# Patient Record
Sex: Female | Born: 1965 | Race: Black or African American | Hispanic: No | Marital: Single | Smoking: Never smoker
Health system: Southern US, Community
[De-identification: ages and names within clinical notes are randomized; demographics above are authoritative.]

## PROBLEM LIST (undated history)

## (undated) DIAGNOSIS — E785 Hyperlipidemia, unspecified: Secondary | ICD-10-CM

## (undated) DIAGNOSIS — H9319 Tinnitus, unspecified ear: Secondary | ICD-10-CM

## (undated) DIAGNOSIS — I10 Essential (primary) hypertension: Secondary | ICD-10-CM

## (undated) HISTORY — DX: Tinnitus, unspecified ear: H93.19

## (undated) HISTORY — PX: ABDOMINAL HYSTERECTOMY: SHX81

---

## 2007-07-21 ENCOUNTER — Ambulatory Visit: Payer: Self-pay | Admitting: Family Medicine

## 2011-01-15 ENCOUNTER — Ambulatory Visit: Payer: Self-pay

## 2011-04-03 ENCOUNTER — Ambulatory Visit: Payer: Self-pay | Admitting: Otolaryngology

## 2011-04-19 ENCOUNTER — Ambulatory Visit: Payer: Self-pay | Admitting: Otolaryngology

## 2012-06-12 LAB — HM MAMMOGRAPHY: HM Mammogram: NEGATIVE

## 2012-12-20 IMAGING — US US CAROTID DUPLEX BILAT
1 series · 17 of 24 positions shown · non-contrast
Comparison: none

REASON FOR EXAM: pulsatile tinnitus
COMMENTS:

[Series 1: us carotid duplex bilat · 17 of 62 slices shown]
[im 1/62]
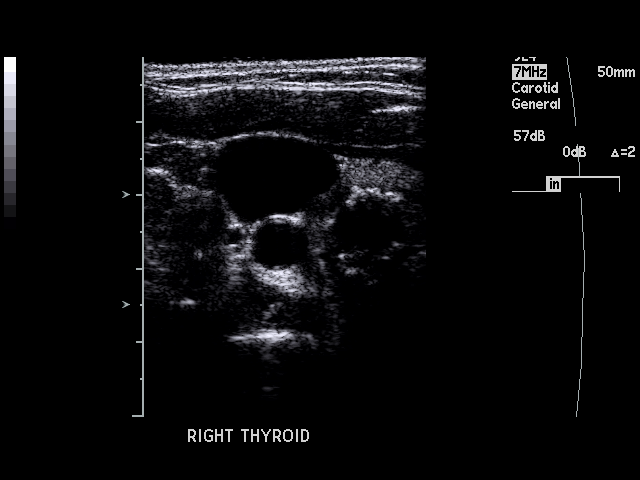
[im 6/62]
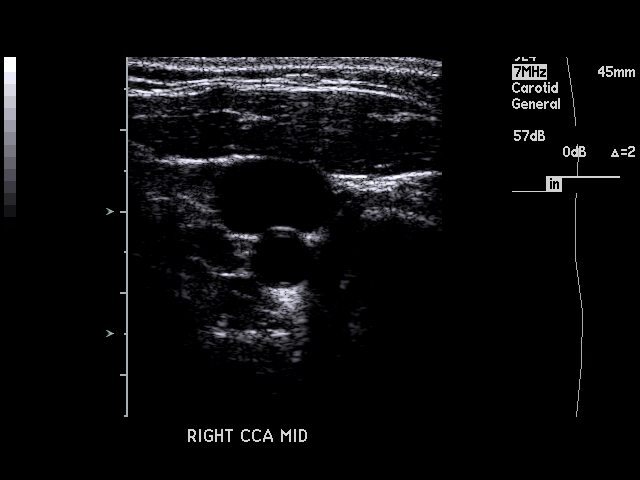
[im 8/62]
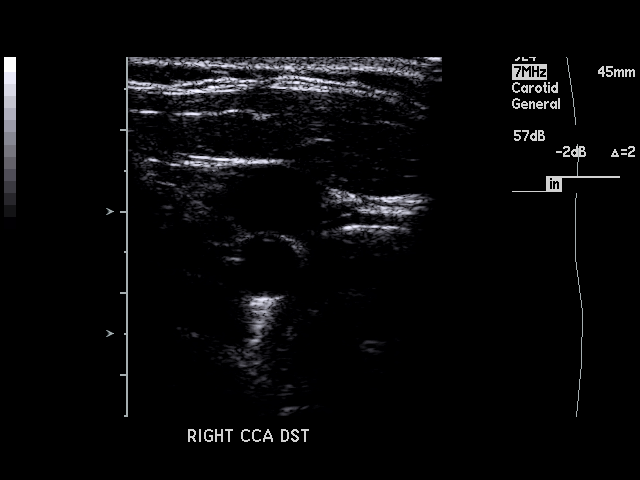
[im 11/62]
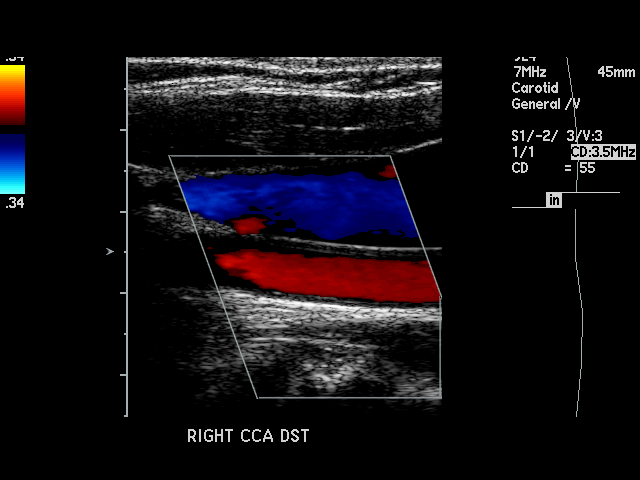
[im 16/62]
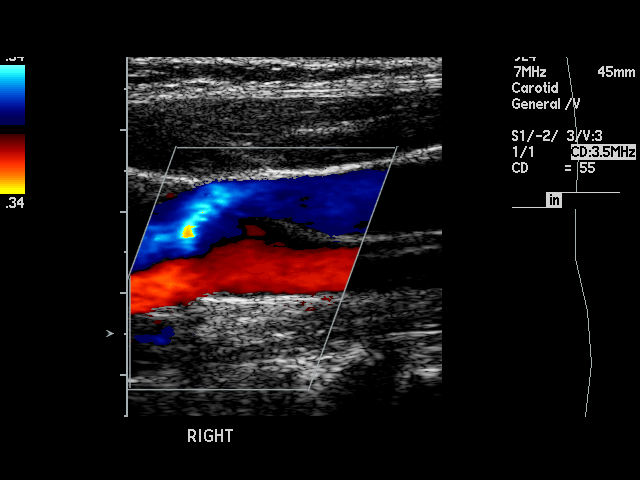
[im 19/62]
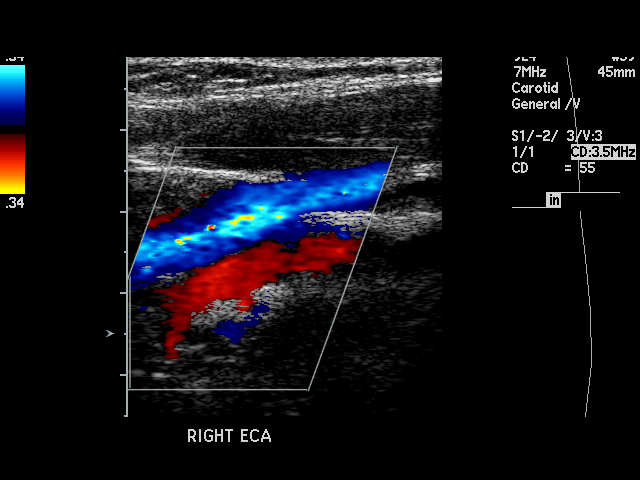
[im 24/62]
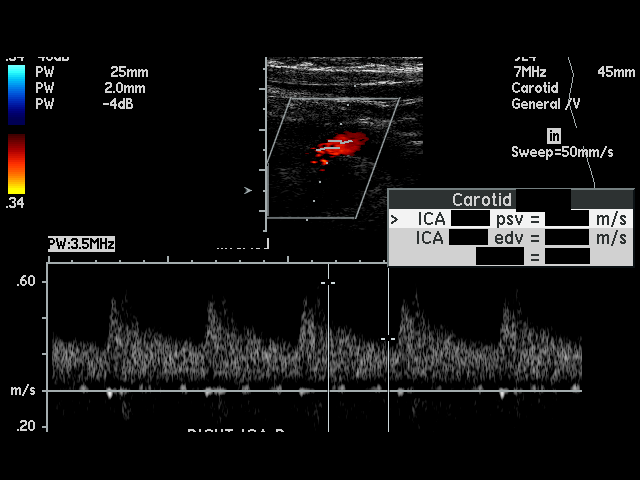
[im 27/62]
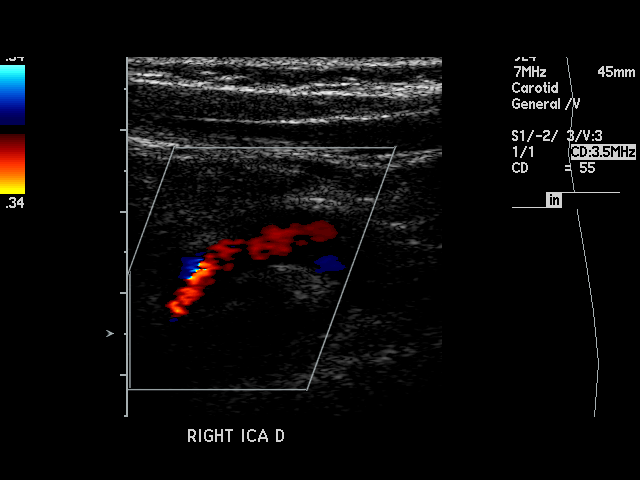
[im 32/62]
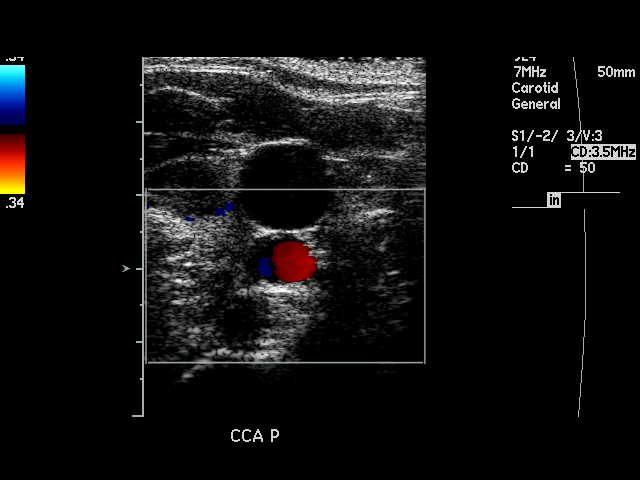
[im 35/62]
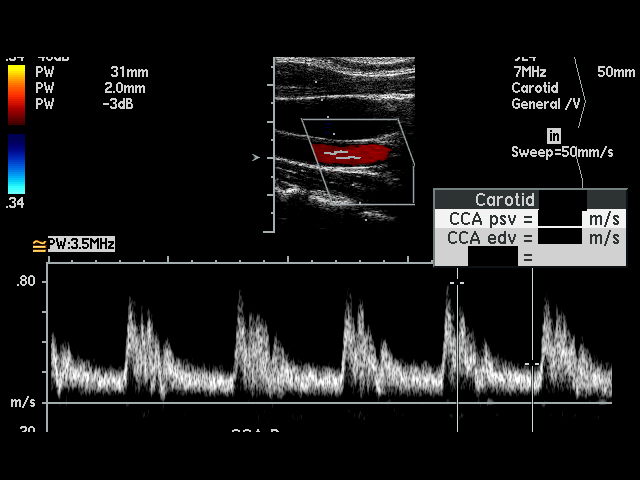
[im 38/62]
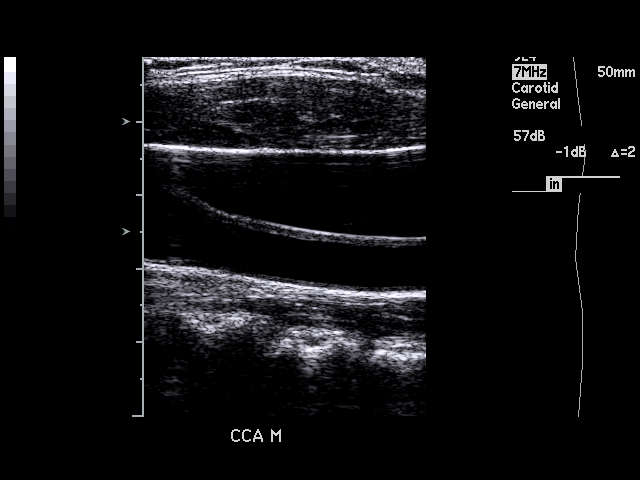
[im 43/62]
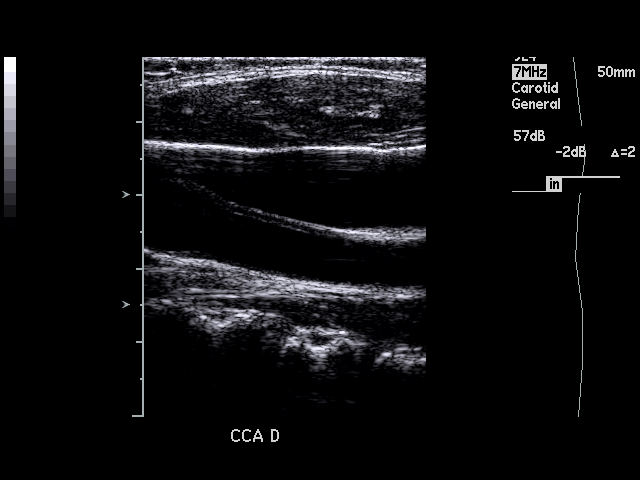
[im 46/62]
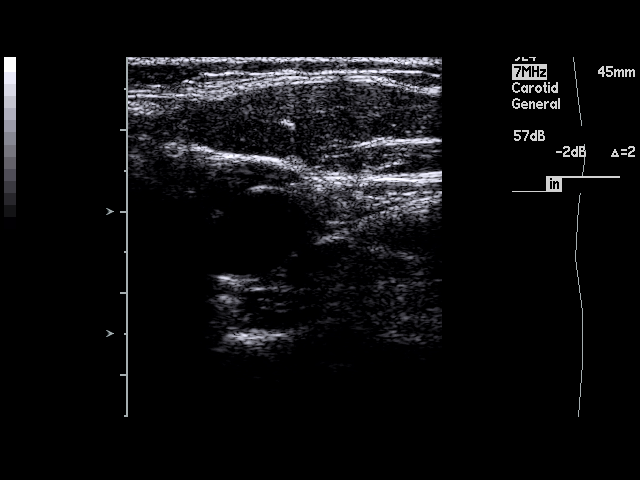
[im 51/62]
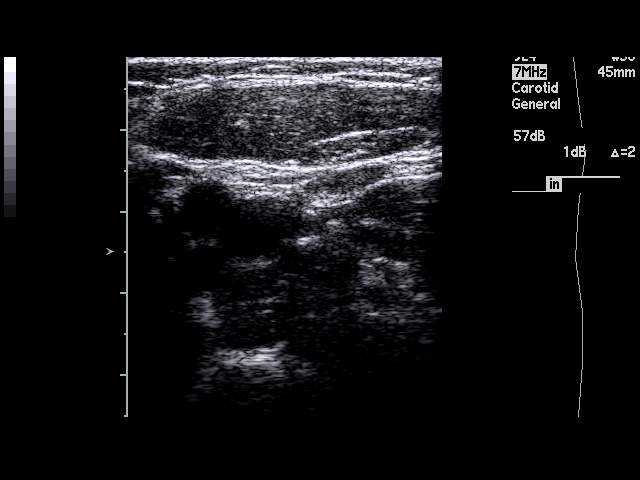
[im 54/62]
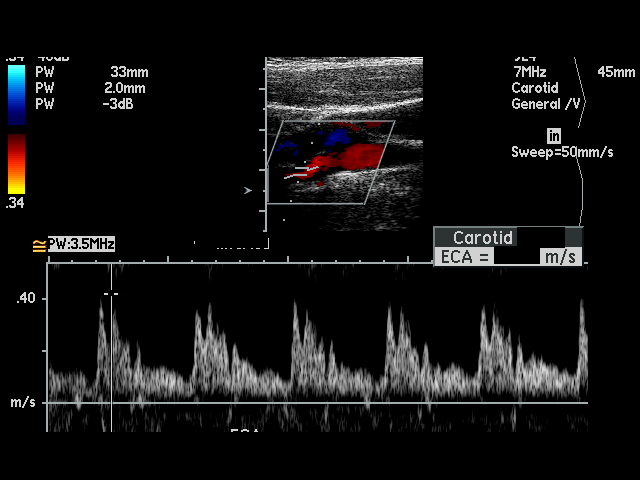
[im 56/62]
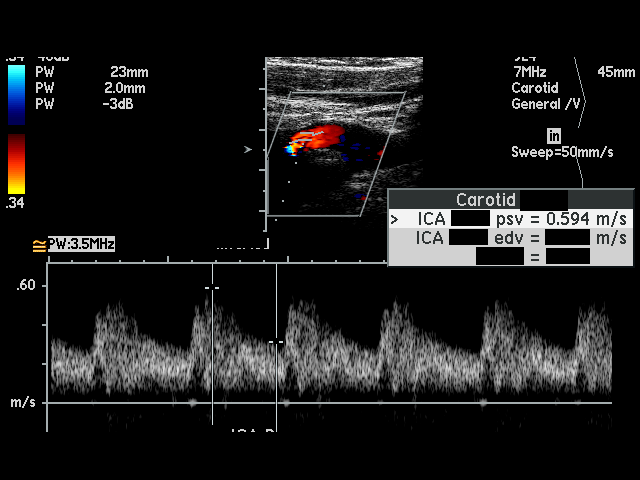
[im 62/62]
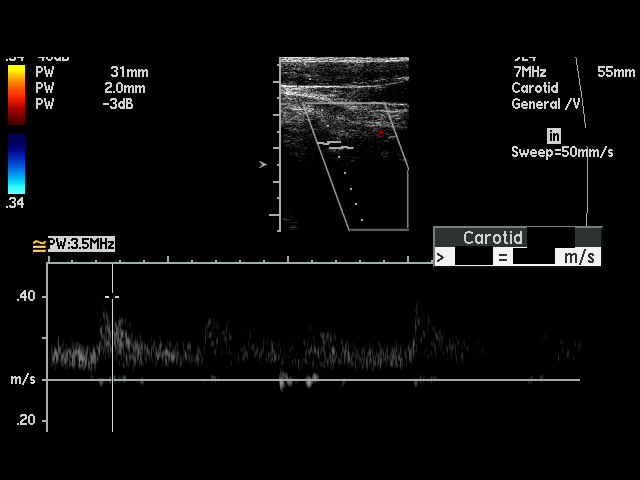

[17 of 24 positions shown; findings below may reference images not displayed]

PROCEDURE:     KOFI BOATENG - KOFI BOATENG CAROTID DOPPLER BILATERAL  - April 03, 2011  [DATE]

RESULT:     Grayscale, duplex, SPECTRAL waveform, color-flow real-time
sonographic imaging was performed of the right and left carotid systems.
Visual evaluation of the right and left carotid system demonstrates no
evidence of mural plaque. Color-flow and SPECTRAL waveform imaging is
unremarkable within the right and left carotid systems. Antegrade flow is
identified within the right and left vertebral arteries.

ICA/CCA ratios: Right: 1.556 and left 1.496.
IMPRESSION: No sonographic evidence of hemodynamically significant stenosis within the
right or left carotid systems.

## 2015-06-13 ENCOUNTER — Encounter: Payer: Self-pay | Admitting: Nurse Practitioner

## 2015-06-13 ENCOUNTER — Ambulatory Visit (INDEPENDENT_AMBULATORY_CARE_PROVIDER_SITE_OTHER): Payer: 59 | Admitting: Nurse Practitioner

## 2015-06-13 VITALS — BP 142/100 | HR 73 | Temp 98.4°F | Resp 16 | Ht 59.0 in | Wt 188.6 lb

## 2015-06-13 DIAGNOSIS — Z7189 Other specified counseling: Secondary | ICD-10-CM | POA: Diagnosis not present

## 2015-06-13 DIAGNOSIS — E669 Obesity, unspecified: Secondary | ICD-10-CM | POA: Diagnosis not present

## 2015-06-13 DIAGNOSIS — I10 Essential (primary) hypertension: Secondary | ICD-10-CM | POA: Diagnosis not present

## 2015-06-13 DIAGNOSIS — H9313 Tinnitus, bilateral: Secondary | ICD-10-CM

## 2015-06-13 DIAGNOSIS — Z7689 Persons encountering health services in other specified circumstances: Secondary | ICD-10-CM

## 2015-06-13 NOTE — Progress Notes (Signed)
Pre visit review using our clinic review tool, if applicable. No additional management support is needed unless otherwise documented below in the visit note. 

## 2015-06-13 NOTE — Patient Instructions (Signed)
Welcome to [facility]!   Nice to meet you today. You are doing great with exercise. Use the tip sheet to help you focus on healthy meals. Follow up in 2 weeks for blood pressure (goal below 140/90) and weight loss (goal loss of 1-2 lbs).

## 2015-06-13 NOTE — Progress Notes (Signed)
Patient ID: Norman Clayonia M Tutterow, female    DOB: 04/18/1966  Age: 49 y.o. MRN: [id number]  CC: Establish Care   HPI Norman Clayonia M Salois presents for   1) New pt info:   Immunizations- tdap 2011; refused flu vaccine  Mammogram- 2013 [place], Normal per pt  Pap- Hysterectomy 15 years ago partial    Ovaries only she thinks   Colonoscopy- Interested in cologuard   Eye Exam- 2014, scheduled   Dental Exam- UTD  LMP- 15 years ago   2) Chronic Problems-  Obesity- 4 days a week 45 min    Diet- No formal   Tinnitus- audiologist, tinnitus- right 5 years   HTN- In the past has had occasional issues with this. Denies being on medications  3) Acute Problems- wants to discuss obesity and hypertension.  5.8 A1c  255 tot chol  136 LDL  History Kymberly has a past medical history of Ringing in ear.   She has past surgical history that includes Abdominal hysterectomy.   Her family history includes Cancer in her father; Stroke in her mother.She reports that she has never smoked. She has never used smokeless tobacco. She reports that she drinks alcohol. She reports that she does not use illicit drugs.  No outpatient prescriptions prior to visit.   No facility-administered medications prior to visit.    ROS Review of Systems  Constitutional: Negative for fever, chills, diaphoresis and fatigue.  HENT: Positive for tinnitus.        Right side only  Respiratory: Negative for chest tightness, shortness of breath and wheezing.   Cardiovascular: Negative for chest pain, palpitations and leg swelling.  Gastrointestinal: Negative for nausea, vomiting and diarrhea.  Skin: Negative for rash.  Neurological: Negative for dizziness, weakness, numbness and headaches.  Psychiatric/Behavioral: Negative for suicidal ideas and sleep disturbance. The patient is nervous/anxious.     Objective:  BP 142/100 mmHg  Pulse 73  Temp(Src) 98.4 F (36.9 C)  Resp 16  Ht 4\' 11"  (1.499 m)  Wt 188 lb 9.6 oz (85.548 kg)   BMI 38.07 kg/m2  SpO2 99%  Physical Exam  Constitutional: She is oriented to person, place, and time. She appears well-developed and well-nourished. No distress.  HENT:  Head: Normocephalic and atraumatic.  Right Ear: External ear normal.  Left Ear: External ear normal.  Cardiovascular: Normal rate, regular rhythm and normal heart sounds.  Exam reveals no gallop and no friction rub.   No murmur heard. Pulmonary/Chest: Effort normal and breath sounds normal. No respiratory distress. She has no wheezes. She has no rales. She exhibits no tenderness.  Neurological: She is alert and oriented to person, place, and time. No cranial nerve deficit. She exhibits normal muscle tone. Coordination normal.  Skin: Skin is warm and dry. No rash noted. She is not diaphoretic.  Psychiatric: She has a normal mood and affect. Her behavior is normal. Judgment and thought content normal.   Assessment & Plan:   Dorisann Framesonia was seen today for establish care.  Diagnoses and all orders for this visit:  Essential hypertension  Encounter to establish care  Obesity  Tinnitus, bilateral  [name] does not currently have medications on file.  No orders of the defined types were placed in this encounter.     Follow-up: Return in about 2 weeks (around 06/27/2015) for HTN and Wt loss.

## 2015-06-18 DIAGNOSIS — Z0000 Encounter for general adult medical examination without abnormal findings: Secondary | ICD-10-CM | POA: Insufficient documentation

## 2015-06-18 DIAGNOSIS — Z7689 Persons encountering health services in other specified circumstances: Secondary | ICD-10-CM | POA: Insufficient documentation

## 2015-06-18 DIAGNOSIS — E669 Obesity, unspecified: Secondary | ICD-10-CM | POA: Insufficient documentation

## 2015-06-18 DIAGNOSIS — I10 Essential (primary) hypertension: Secondary | ICD-10-CM | POA: Insufficient documentation

## 2015-06-18 DIAGNOSIS — H9319 Tinnitus, unspecified ear: Secondary | ICD-10-CM | POA: Insufficient documentation

## 2015-06-18 NOTE — Assessment & Plan Note (Addendum)
BP Readings from Last 3 Encounters:  06/13/15 142/100   Patient reports she has had occasionally elevated blood pressures in the past. We will follow-up in 2 weeks if she still is elevated we will start on medications.

## 2015-06-18 NOTE — Assessment & Plan Note (Addendum)
Education provided on exercise and healthy diet. Benefits and goals discussed. Follow-up in 2 weeks

## 2015-06-18 NOTE — Assessment & Plan Note (Signed)
Right side only has been evaluated by an audiologist approximately 5 years.

## 2015-06-18 NOTE — Assessment & Plan Note (Signed)
Discussed acute and chronic issues. Reviewed health maintenance measures, PFSHx, and immunizations. Obtain records from previous facility.   

## 2015-06-27 ENCOUNTER — Ambulatory Visit (INDEPENDENT_AMBULATORY_CARE_PROVIDER_SITE_OTHER): Payer: 59 | Admitting: Nurse Practitioner

## 2015-06-27 VITALS — BP 128/78 | HR 78 | Temp 98.2°F | Resp 16 | Ht 59.0 in | Wt 187.2 lb

## 2015-06-27 DIAGNOSIS — Z1239 Encounter for other screening for malignant neoplasm of breast: Secondary | ICD-10-CM | POA: Diagnosis not present

## 2015-06-27 DIAGNOSIS — I10 Essential (primary) hypertension: Secondary | ICD-10-CM | POA: Diagnosis not present

## 2015-06-27 DIAGNOSIS — E669 Obesity, unspecified: Secondary | ICD-10-CM

## 2015-06-27 NOTE — Patient Instructions (Signed)
[facility]  Address: [address], [place], [place] [address]  Phone: [phone] Hours:  Monday - Thursday: 8 a.m. - 5 p.m. Friday: 8 a.m. - 3 p.m.  See you in 3 months!

## 2015-06-27 NOTE — Progress Notes (Signed)
Pre visit review using our clinic review tool, if applicable. No additional management support is needed unless otherwise documented below in the visit note. 

## 2015-06-27 NOTE — Progress Notes (Signed)
Patient ID: Emma Riggs, female    DOB: 09/25/1965  Age: 49 y.o. MRN: [id number]  CC: Follow-up   HPI Emma Riggs presents for follow up of weight loss and HTN.   1) Walking- 3 x a week for a few minutes Diet- Cutting down, substituting, and watching portions  Wt Readings from Last 3 Encounters:  06/27/15 187 lb 3.2 oz (84.913 kg)  06/13/15 188 lb 9.6 oz (85.548 kg)   BP Readings from Last 3 Encounters:  06/27/15 128/78  06/13/15 142/100    History Emma Riggs has a past medical history of Ringing in ear.   Emma Riggs has past surgical history that includes Abdominal hysterectomy.   Emma Riggs family history includes Cancer in Emma Riggs father; Stroke in Emma Riggs mother.Emma Riggs reports that Emma Riggs has never smoked. Emma Riggs has never used smokeless tobacco. Emma Riggs reports that Emma Riggs drinks alcohol. Emma Riggs reports that Emma Riggs does not use illicit drugs.  No outpatient prescriptions prior to visit.   No facility-administered medications prior to visit.    ROS Review of Systems  Constitutional: Negative for fever, chills, diaphoresis and fatigue.  Respiratory: Negative for chest tightness, shortness of breath and wheezing.   Cardiovascular: Negative for chest pain, palpitations and leg swelling.  Gastrointestinal: Negative for nausea, vomiting and diarrhea.  Skin: Negative for rash.  Neurological: Negative for dizziness, weakness, numbness and headaches.  Psychiatric/Behavioral: The patient is not nervous/anxious.     Objective:  BP 128/78 mmHg  Pulse 78  Temp(Src) 98.2 F (36.8 C)  Resp 16  Ht 4\' 11"  (1.499 m)  Wt 187 lb 3.2 oz (84.913 kg)  BMI 37.79 kg/m2  SpO2 98%  Physical Exam  Constitutional: Emma Riggs is oriented to person, place, and time. Emma Riggs appears well-developed and well-nourished. No distress.  HENT:  Head: Normocephalic and atraumatic.  Right Ear: External ear normal.  Left Ear: External ear normal.  Cardiovascular: Normal rate, regular rhythm and normal heart sounds.  Exam reveals no gallop and no  friction rub.   No murmur heard. Pulmonary/Chest: Effort normal and breath sounds normal. No respiratory distress. Emma Riggs has no wheezes. Emma Riggs has no rales. Emma Riggs exhibits no tenderness.  Neurological: Emma Riggs is alert and oriented to person, place, and time. No cranial nerve deficit. Emma Riggs exhibits normal muscle tone. Coordination normal.  Skin: Skin is warm and dry. No rash noted. Emma Riggs is not diaphoretic.  Psychiatric: Emma Riggs has a normal mood and affect. Emma Riggs behavior is normal. Judgment and thought content normal.   Assessment & Plan:   Emma Riggs was seen today for follow-up.  Diagnoses and all orders for this visit:  Screening for breast cancer -     MM Digital Screening; Future  Essential hypertension  Obesity   Emma Riggs does not currently have medications on file.  No orders of the defined types were placed in this encounter.     Follow-up: Return in about 3 months (around 09/27/2015) for Weight loss.

## 2015-07-02 ENCOUNTER — Encounter: Payer: Self-pay | Admitting: Nurse Practitioner

## 2015-07-02 DIAGNOSIS — Z1239 Encounter for other screening for malignant neoplasm of breast: Secondary | ICD-10-CM | POA: Insufficient documentation

## 2015-07-02 NOTE — Assessment & Plan Note (Signed)
BP Readings from Last 3 Encounters:  06/27/15 128/78  06/13/15 142/100   Improved. Will follow

## 2015-07-02 NOTE — Assessment & Plan Note (Signed)
Wt Readings from Last 3 Encounters:  06/27/15 187 lb 3.2 oz (84.913 kg)  06/13/15 188 lb 9.6 oz (85.548 kg)   Down 1 pound since last visit. Encouraged healthy eating and exercise. Gave handout with low carb options. Will follow up in 3 months.

## 2015-07-02 NOTE — Assessment & Plan Note (Signed)
Ordered Mammogram.

## 2015-10-03 ENCOUNTER — Ambulatory Visit: Payer: 59 | Admitting: Nurse Practitioner

## 2015-10-12 ENCOUNTER — Encounter: Payer: Self-pay | Admitting: Nurse Practitioner

## 2015-10-26 ENCOUNTER — Ambulatory Visit
Admission: RE | Admit: 2015-10-26 | Discharge: 2015-10-26 | Disposition: A | Discharge status: Home / Self Care | Payer: 59 | Source: Ambulatory Visit | Attending: Nurse Practitioner | Admitting: Nurse Practitioner

## 2015-10-26 ENCOUNTER — Ambulatory Visit: Payer: 59 | Admitting: Nurse Practitioner

## 2015-10-26 DIAGNOSIS — Z1231 Encounter for screening mammogram for malignant neoplasm of breast: Secondary | ICD-10-CM | POA: Diagnosis present

## 2015-10-26 DIAGNOSIS — Z1239 Encounter for other screening for malignant neoplasm of breast: Secondary | ICD-10-CM

## 2016-05-30 ENCOUNTER — Ambulatory Visit (INDEPENDENT_AMBULATORY_CARE_PROVIDER_SITE_OTHER): Payer: 59 | Admitting: Family

## 2016-05-30 ENCOUNTER — Encounter: Payer: Self-pay | Admitting: Family

## 2016-05-30 DIAGNOSIS — E782 Mixed hyperlipidemia: Secondary | ICD-10-CM | POA: Diagnosis not present

## 2016-05-30 DIAGNOSIS — Z7689 Persons encountering health services in other specified circumstances: Secondary | ICD-10-CM

## 2016-05-30 DIAGNOSIS — I10 Essential (primary) hypertension: Secondary | ICD-10-CM | POA: Diagnosis not present

## 2016-05-30 DIAGNOSIS — E785 Hyperlipidemia, unspecified: Secondary | ICD-10-CM | POA: Insufficient documentation

## 2016-05-30 NOTE — Progress Notes (Signed)
Subjective:    Patient ID: Emma Riggs, female    DOB: [birth year], 50 y.o.   MRN: [id number]  CC: Emma Riggs is a 50 y.o. female who presents today to establish care.   HPI: Patient is here for follow-up and to establish care. She was last seen in our clinic 1 year ago. No complaints however she remains pressured by her weight.  She is recently started walking again and went to the gym for 1 hour this week. She is caring for her aging mother and does not have the time to care for herself.  She brought with her labs from her screening at her employer which we discussed in great detail. These have also been scanned her chart. Her blood pressure screening had been 142 SBP and she was worried she had HTN. She has never been treated for high blood pressure. Patient is not having any shortness of breath, chest pain, palpitations, changes in vision, headache.  No family history of colon cancer. She has no personal history of abdominal bleeds. She would prefer cologuard.      HISTORY:  Past Medical History:  Diagnosis Date  . Ringing in ear    Past Surgical History:  Procedure Laterality Date  . ABDOMINAL HYSTERECTOMY     Partial   Family History  Problem Relation Age of Onset  . Stroke Mother   . Cancer Father     Lung Cancer    Allergies: Review of patient's allergies indicates no known allergies. No current outpatient prescriptions on file prior to visit.   No current facility-administered medications on file prior to visit.     Social History  Substance Use Topics  . Smoking status: Never Smoker  . Smokeless tobacco: Never Used  . Alcohol use 0.0 oz/week     Comment: Rare    Review of Systems  Constitutional: Negative for chills and fever.  Eyes: Negative for visual disturbance.  Respiratory: Negative for cough.   Cardiovascular: Negative for chest pain and palpitations.  Gastrointestinal: Negative for nausea and vomiting.  Neurological: Negative for  headaches.      Objective:    BP 136/88   Pulse 90   Temp 98 F (36.7 C) (Oral)   Ht 4\' 11"  (1.499 m)   Wt 190 lb 12.8 oz (86.5 kg)   SpO2 96%   BMI 38.54 kg/m  BP Readings from Last 3 Encounters:  05/30/16 136/88  06/27/15 128/78  06/13/15 (!) 142/100   Wt Readings from Last 3 Encounters:  05/30/16 190 lb 12.8 oz (86.5 kg)  06/27/15 187 lb 3.2 oz (84.9 kg)  06/13/15 188 lb 9.6 oz (85.5 kg)    Physical Exam  Constitutional: She appears well-developed and well-nourished.  Eyes: Conjunctivae are normal.  Cardiovascular: Normal rate, regular rhythm, normal heart sounds and normal pulses.   Pulmonary/Chest: Effort normal and breath sounds normal. She has no wheezes. She has no rhonchi. She has no rales.  Neurological: She is alert.  Skin: Skin is warm and dry.  Psychiatric: She has a normal mood and affect. Her speech is normal and behavior is normal. Thought content normal.  Vitals reviewed.      Assessment & Plan:   Problem List Items Addressed This Visit      Cardiovascular and Mediastinum   Essential hypertension    At goal today.We discussed at great length the importance of lifestyle modifications. We'll continue to follow.         Other  Encounter to establish care    Reviewed past medical, surgical and family history. Order mammogram and Cologuard for patient today. Patient will follow-up in a couple months.      Hyperlipemia    Elevated based on screening labs by her employer. We discussed at great length the importance of lifestyle modifications. Patient preferred to start lifestyle prior to medication treatment today. She will follow-up in 3 months.       Other Visit Diagnoses   None.      [name] does not currently have medications on file.   No orders of the defined types were placed in this encounter.   Return precautions given.   Risks, benefits, and alternatives of the medications and treatment plan prescribed today were  discussed, and patient expressed understanding.   Education regarding symptom management and diagnosis given to patient on AVS.  Continue to follow with Rennie PlowmanMargaret Arnett, FNP for routine health maintenance.   Norman Clayonia M Consolo and I agreed with plan.   Rennie PlowmanMargaret Arnett, FNP

## 2016-05-30 NOTE — Progress Notes (Signed)
Pre visit review using our clinic review tool, if applicable. No additional management support is needed unless otherwise documented below in the visit note. 

## 2016-05-30 NOTE — Assessment & Plan Note (Addendum)
At goal today.We discussed at great length the importance of lifestyle modifications. We'll continue to follow.

## 2016-05-30 NOTE — Assessment & Plan Note (Addendum)
New.Elevated based on screening labs by her employer. We discussed at great length the importance of lifestyle modifications. Patient preferred to start lifestyle prior to medication treatment today. She will follow-up in 3 months.

## 2016-05-30 NOTE — Patient Instructions (Signed)
As We discussed, lets focus on diet and exercise to turn the ship around to prevent high cholesterol and high blood pressure.  I know you can do it!  See you in 3 months.   This is  Dr. Melina Schoolsullo's  example of a  "Low GI"  Diet:  It will allow you to lose 4 to 8  lbs  per month if you follow it carefully.  Your goal with exercise is a minimum of 30 minutes of aerobic exercise 5 days per week (Walking does not count once it becomes easy!)    All of the foods can be found at grocery stores and in bulk at [employer].  The Atkins protein bars and shakes are available in more varieties at Target, WalMart and Lowe's Foods.     7 AM Breakfast:  Choose from the following:  Low carbohydrate Protein  Shakes (I recommend the  Premier Protein chocolate shakes,  EAS AdvantEdge "Carb Control" shakes  Or the Atkins shakes all are under 3 net carbs)     a scrambled egg/bacon/cheese burrito made with Mission's "carb balance" whole wheat tortilla  (about 10 net carbs )  Medical laboratory scientific officerJimmy Deans sells microwaveable frittata (basically a quiche without the pastry crust) that is eaten cold and very convenient way to get your eggs.  8 carbs)  If you make your own protein shakes, avoid bananas and pineapple,  And use low carb greek yogurt or original /unsweetened almond or soy milk    Avoid cereal and bananas, oatmeal and cream of wheat and grits. They are loaded with carbohydrates!   10 AM: high protein snack:  Protein bar by Atkins (the snack size, under 200 cal, usually < 6 net carbs).    A stick of cheese:  Around 1 carb,  100 cal     Dannon Light n Fit [place]Greek Yogurt  (80 cal, 8 carbs)  Other so called "protein bars" and Greek yogurts tend to be loaded with carbohydrates.  Remember, in food advertising, the word "energy" is synonymous for " carbohydrate."  Lunch:   A Sandwich using the bread choices listed, Can use any  Eggs,  lunchmeat, grilled meat or canned tuna), avocado, regular mayo/mustard  and cheese.  A Salad using  blue cheese, ranch,  Goddess or vinagrette,  Avoid taco shells, croutons or "confetti" and no "candied nuts" but regular nuts OK.   No pretzels, nabs  or chips.  Pickles and miniature sweet peppers are a good low carb alternative that provide a "crunch"  The bread is the only source of carbohydrate in a sandwich and  can be decreased by trying some of the attached alternatives to traditional loaf bread   Avoid "Low fat dressings, as well as Reyne DumasCatalina and [place] Food[place] dressings They are loaded with sugar!   3 PM/ Mid day  Snack:  Consider  1 ounce of  almonds, walnuts, pistachios, pecans, peanuts,  Macadamia nuts or a nut medley.  Avoid "granola and granola bars "  Mixed nuts are ok in moderation as long as there are no raisins,  cranberries or dried fruit.   KIND bars are OK if you get the low glycemic index variety   Try the prosciutto/mozzarella cheese sticks by Fiorruci  In deli /backery section   High protein      6 PM  Dinner:     Meat/fowl/fish with a green salad, and either broccoli, cauliflower, green beans, spinach, brussel sprouts or  Lima beans. DO NOT BREAD THE  PROTEIN!!      There is a low carb pasta by Dreamfield's that is acceptable and tastes great: only 5 digestible carbs/serving.( All grocery stores but BJs carry it ) Several ready made meals are available low carb:   Try Michel Angelo's chicken piccata or chicken or eggplant parm over low carb pasta.(Lowes and BJs)   Clifton Custard Sanchez's "Carnitas" (pulled pork, no sauce,  0 carbs) or his beef pot roast to make a dinner burrito (at BJ's)  Pesto over low carb pasta (bj's sells a good quality pesto in the center refrigerated section of the deli   Try satueeing  Roosvelt Harps with mushroooms as a good side   Green Giant makes a mashed cauliflower that tastes like mashed potatoes  Whole wheat pasta is still full of digestible carbs and  Not as low in glycemic index as Dreamfield's.   Brown rice is still rice,  So skip the rice and  noodles if you eat [place] or [place] (or at least limit to 1/2 cup)  9 PM snack :   Breyer's "low carb" fudgsicle or  ice cream bar (Carb Smart line), or  Weight Watcher's ice cream bar , or another "no sugar added" ice cream;  a serving of fresh berries/cherries with whipped cream   Cheese or DANNON'S LlGHT N FIT GREEK YOGURT  8 ounces of [place] unsweetened almond/cococunut milk    Treat yourself to a parfait made with whipped cream blueberiies, walnuts and vanilla greek yogurt  Avoid bananas, pineapple, grapes  and watermelon on a regular basis because they are high in sugar.  THINK OF THEM AS DESSERT  Remember that snack Substitutions should be less than 10 NET carbs per serving and meals < 20 carbs. Remember to subtract fiber grams to get the "net carbs."  @TULLOBREADPACKAGE @

## 2016-05-30 NOTE — Assessment & Plan Note (Signed)
Reviewed past medical, surgical and family history. Order mammogram and Cologuard for patient today. Patient will follow-up in a couple months.

## 2016-06-05 ENCOUNTER — Telehealth: Payer: Self-pay | Admitting: Family

## 2016-06-05 NOTE — Telephone Encounter (Signed)
Pt dropped off paper work for [employer]fl[employer]rnett to complete. Papers are up front in color folder.

## 2016-06-05 NOTE — Telephone Encounter (Signed)
Paperwork has been placed in provider folder on desk.

## 2016-06-27 LAB — COLOGUARD: COLOGUARD: NEGATIVE

## 2016-09-05 ENCOUNTER — Ambulatory Visit: Payer: 59 | Admitting: Family

## 2016-10-17 ENCOUNTER — Telehealth: Payer: Self-pay

## 2016-10-17 NOTE — Telephone Encounter (Signed)
Spoke with pt and she wanted to know if her cologuard results had been received. It doesn't look like they have been reviewed. Please advise.

## 2016-10-21 NOTE — Telephone Encounter (Signed)
Please call pt-  Results are negative for cologuard

## 2016-10-22 NOTE — Telephone Encounter (Signed)
Patient was informed of results.  Patient understood and no questions, comments, or concerns at this time.  

## 2017-06-17 ENCOUNTER — Ambulatory Visit (INDEPENDENT_AMBULATORY_CARE_PROVIDER_SITE_OTHER): Payer: 59 | Admitting: Family

## 2017-06-17 ENCOUNTER — Encounter: Payer: Self-pay | Admitting: Family

## 2017-06-17 VITALS — BP 134/96 | HR 82 | Temp 97.9°F | Ht 59.0 in | Wt 196.2 lb

## 2017-06-17 DIAGNOSIS — Z1239 Encounter for other screening for malignant neoplasm of breast: Secondary | ICD-10-CM

## 2017-06-17 DIAGNOSIS — I10 Essential (primary) hypertension: Secondary | ICD-10-CM

## 2017-06-17 DIAGNOSIS — Z1231 Encounter for screening mammogram for malignant neoplasm of breast: Secondary | ICD-10-CM | POA: Diagnosis not present

## 2017-06-17 DIAGNOSIS — E6609 Other obesity due to excess calories: Secondary | ICD-10-CM

## 2017-06-17 NOTE — Progress Notes (Signed)
Subjective:    Patient ID: Emma Riggs, female    DOB: Dec 24, 1965, 51 y.o.   MRN: [id number]  CC: Emma Riggs is a 51 y.o. female who presents today for follow up.   HPI: Of note, reviewed labs with patient. Low cardiovascular risk. She is prediabetic.   Elevated blood pressure. Would like to start exercise program again. Drinking soda.   Denies exertional chest pain or pressure, numbness or tingling radiating to left arm or jaw, palpitations, dizziness, frequent headaches, changes in vision, or shortness of breath.      HISTORY:  Past Medical History:  Diagnosis Date  . Ringing in ear    Past Surgical History:  Procedure Laterality Date  . ABDOMINAL HYSTERECTOMY     Partial   Family History  Problem Relation Age of Onset  . Stroke Mother   . Cancer Father        Lung Cancer    Allergies: Patient has no known allergies. No current outpatient prescriptions on file prior to visit.   No current facility-administered medications on file prior to visit.     Social History  Substance Use Topics  . Smoking status: Never Smoker  . Smokeless tobacco: Never Used  . Alcohol use 0.0 oz/week     Comment: Rare    Review of Systems  Constitutional: Negative for chills and fever.  Respiratory: Negative for cough.   Cardiovascular: Negative for chest pain and palpitations.  Gastrointestinal: Negative for nausea and vomiting.      Objective:    BP (!) 134/96   Pulse 82   Temp 97.9 F (36.6 C) (Oral)   Ht 4\' 11"  (1.499 m)   Wt 196 lb 3.2 oz (89 kg)   SpO2 99%   BMI 39.63 kg/m  BP Readings from Last 3 Encounters:  06/17/17 (!) 134/96  05/30/16 136/88  06/27/15 128/78   Wt Readings from Last 3 Encounters:  06/17/17 196 lb 3.2 oz (89 kg)  05/30/16 190 lb 12.8 oz (86.5 kg)  06/27/15 187 lb 3.2 oz (84.9 kg)    Physical Exam  Constitutional: She appears well-developed and well-nourished.  Eyes: Conjunctivae are normal.  Cardiovascular: Normal rate, regular  rhythm, normal heart sounds and normal pulses.   Pulmonary/Chest: Effort normal and breath sounds normal. She has no wheezes. She has no rhonchi. She has no rales.  Neurological: She is alert.  Skin: Skin is warm and dry.  Psychiatric: She has a normal mood and affect. Her speech is normal and behavior is normal. Thought content normal.  Vitals reviewed.      Assessment & Plan:   Problem List Items Addressed This Visit      Cardiovascular and Mediastinum   Essential hypertension    Elevated. Patient would like to focus on weight loss. She will keep BP log.        Other   Obesity    Discussed lifestyle modifications. Form completed for work. Note: reviewed labs with patient as well.       Screening for breast cancer - Primary    Would like to do mammogram every 2 years. Will do next year.           Emma Riggs does not currently have medications on file.   No orders of the defined types were placed in this encounter.   Return precautions given.   Risks, benefits, and alternatives of the medications and treatment plan prescribed today were discussed, and patient expressed understanding.  Education regarding symptom management and diagnosis given to patient on AVS.  Continue to follow with Emma Riggs, Emma Placide G, FNP for routine health maintenance.   Emma Riggs and I agreed with plan.   Emma PlowmanMargaret Hollyanne Schloesser, FNP

## 2017-06-17 NOTE — Assessment & Plan Note (Signed)
Discussed lifestyle modifications. Form completed for work. Note: reviewed labs with patient as well.

## 2017-06-17 NOTE — Patient Instructions (Addendum)
Monitor blood pressure,  Goal is less than 130/80; if persistently higher, please make sooner follow up appointment so we can recheck you blood pressure and manage medications  Come back back year for complete physical - we can also do pelvic exam to ensure no cervix after hysterectomy.   Good luck with working out-- you can DO IT!!

## 2017-06-17 NOTE — Assessment & Plan Note (Signed)
Elevated. Patient would like to focus on weight loss. She will keep BP log.

## 2017-06-17 NOTE — Assessment & Plan Note (Signed)
Would like to do mammogram every 2 years. Will do next year.

## 2017-12-10 ENCOUNTER — Ambulatory Visit: Payer: 59 | Admitting: Family Medicine

## 2017-12-10 ENCOUNTER — Other Ambulatory Visit: Payer: Self-pay

## 2017-12-10 DIAGNOSIS — H6121 Impacted cerumen, right ear: Secondary | ICD-10-CM

## 2017-12-10 DIAGNOSIS — H6120 Impacted cerumen, unspecified ear: Secondary | ICD-10-CM | POA: Insufficient documentation

## 2017-12-10 NOTE — Progress Notes (Signed)
  Emma AlarEric Branko Steeves, MD Phone: [phone]  Emma Riggs is a 52 y.o. female who presents today for same day visit.  Patient reports over the last week her right ear has felt a little swollen.  Notes it feels plugged up and she can not hear out of it very well.  It has not drained anything.  No pain.  States feels a little swollen anterior to this.  She notes she can hear her heartbeat in her ear though that has been chronic and she has been evaluated for that previously by ENT and had imaging done.  In the past she has tried Benadryl to help with this.  She has not tried this currently.  Social History   Tobacco Use  Smoking Status Never Smoker  Smokeless Tobacco Never Used     ROS see history of present illness  Objective  Physical Exam Vitals:   12/10/17 1555  BP: 112/88  Pulse: 72  Temp: 97.8 F (36.6 C)  SpO2: 99%    BP Readings from Last 3 Encounters:  12/10/17 112/88  06/17/17 (!) 134/96  05/30/16 136/88   Wt Readings from Last 3 Encounters:  12/10/17 202 lb 12.8 oz (92 kg)  06/17/17 196 lb 3.2 oz (89 kg)  05/30/16 190 lb 12.8 oz (86.5 kg)    Physical Exam  Constitutional: No distress.  HENT:  Head: Normocephalic and atraumatic.  Mouth/Throat: Oropharynx is clear and moist. No oropharyngeal exudate.  Right ear canal filled with cerumen, irrigated by CMA revealing a normal TM and normal ear canal with resolution in patient's symptoms of ear fullness and swelling, left TM and ear canal normal with minimal cerumen  Cardiovascular: Normal rate, regular rhythm and normal heart sounds.  Pulmonary/Chest: Effort normal.  Musculoskeletal: She exhibits no edema.  Neurological: She is alert.  Skin: Skin is warm and dry. She is not diaphoretic.     Assessment/Plan: Please see individual problem list.  Cerumen impaction Right ear cerumen impaction.  Irrigated by [employer] with good results.  Symptoms resolved.  Discussed Debrox over-the-counter when she feels as though  she is getting cerumen buildup.  If she has persistent recurrent issues could refer to ENT.   No orders of the defined types were placed in this encounter.   No orders of the defined types were placed in this encounter.    Emma AlarEric Vernell Townley, MD [facility] [place] [facility]

## 2017-12-10 NOTE — Assessment & Plan Note (Signed)
Right ear cerumen impaction.  Irrigated by [employer] with good results.  Symptoms resolved.  Discussed Debrox over-the-counter when she feels as though she is getting cerumen buildup.  If she has persistent recurrent issues could refer to ENT.

## 2017-12-10 NOTE — Patient Instructions (Signed)
Nice to see you. You can try Debrox over-the-counter when you feel as though you are accumulating wax in your ears. If that is not helpful please contact us.

## 2018-06-12 ENCOUNTER — Telehealth: Payer: Self-pay

## 2018-06-12 NOTE — Telephone Encounter (Signed)
Copied from CRM [id number]. Topic: General - Other >> Jun 12, 2018  3:20 PM Lorrine Kin, [place] wrote: Reason for CRM: patient calling and states that she spoke with someone about getting an wellness visit with Arnett. States that she has a form that needs to be filled out by the 06/24/18. Informed her that she would likely need an appointment since she has not seen Arnett since 06/17/17. Patient very addiment about getting form filled out. States that she was not able to be seen before the 30th and that this form has to be filled out. States that she is willing to come back for a visit after the form is complete. Please advise.

## 2018-06-15 NOTE — Telephone Encounter (Signed)
Spoke with patient per Emma Riggs ok to schedule on 06/17/18 at 11:30 patient informed placed on schedule

## 2018-06-15 NOTE — Telephone Encounter (Signed)
Call pt  Why cant she get an appt with me or Lauren this week?   Due on 30th so we have time

## 2018-06-17 ENCOUNTER — Encounter: Payer: Self-pay | Admitting: Family

## 2018-06-17 ENCOUNTER — Ambulatory Visit: Payer: 59 | Admitting: Family

## 2018-06-17 VITALS — BP 124/100 | HR 77 | Temp 97.7°F | Resp 15 | Ht 59.0 in | Wt 204.4 lb

## 2018-06-17 DIAGNOSIS — E6609 Other obesity due to excess calories: Secondary | ICD-10-CM | POA: Diagnosis not present

## 2018-06-17 DIAGNOSIS — I10 Essential (primary) hypertension: Secondary | ICD-10-CM | POA: Diagnosis not present

## 2018-06-17 MED ORDER — AMLODIPINE BESYLATE 5 MG PO TABS: 5.0000 mg | ORAL_TABLET | Freq: Every day | ORAL | 3 refills | Status: DC

## 2018-06-17 NOTE — Patient Instructions (Addendum)
MMR booster at health department.   Start amlodipine  Monitor blood pressure,  Goal is less than 120/80, based on newest guidelines; if persistently higher, please make sooner follow up appointment so we can recheck you blood pressure and manage medications  Today we discussed referrals, orders. Nutrition, medical weight loss    I have placed these orders in the system for you.  Please be sure to give [place] a call if you have not heard from our office regarding this. We should hear from [place] within ONE week with information regarding your appointment. If not, please let me know immediately.    Your lab work reflects that you are in the pre-diabetes range, if a1c is between 5.7 to 6.4%. A normal fasting blood glucose is less than 100.  A fasting blood glucose greater than 126 is diagnostic of diabetes.  It is very important to maintain an exercise program and limit processed, sugary foods.   This is  Dr. Lupita Dawn  example of a  "Low GI"  Diet:  It will allow you to lose 4 to 8  lbs  per month if you follow it carefully.  Your goal with exercise is a minimum of 30 minutes of aerobic exercise 5 days per week (Walking does not count once it becomes easy!)    All of the foods can be found at grocery stores and in bulk at [employer].  The Atkins protein bars and shakes are available in more varieties at Target, [employer] and [place].     7 AM Breakfast:  Choose from the following:  Low carbohydrate Protein  Shakes (I recommend the  Premier Protein chocolate shakes,  EAS AdvantEdge "Carb Control" shakes  Or the Atkins shakes all are under 3 net carbs)     a scrambled egg/bacon/cheese burrito made with Mission's "carb balance" whole wheat tortilla  (about 10 net carbs )  Regulatory affairs officer (basically a quiche without the pastry crust) that is eaten cold and very convenient way to get your eggs.  8 carbs)  If you make your own protein shakes, avoid bananas and pineapple,  And use low  carb greek yogurt or original /unsweetened almond or soy milk    Avoid cereal and bananas, oatmeal and cream of wheat and grits. They are loaded with carbohydrates!   10 AM: high protein snack:  Protein bar by Atkins (the snack size, under 200 cal, usually < 6 net carbs).    A stick of cheese:  Around 1 carb,  100 cal     Dannon Light n Fit [place] Yogurt  (80 cal, 8 carbs)  Other so called "protein bars" and Greek yogurts tend to be loaded with carbohydrates.  Remember, in food advertising, the word "energy" is synonymous for " carbohydrate."  Lunch:   A Sandwich using the bread choices listed, Can use any  Eggs,  lunchmeat, grilled meat or canned tuna), avocado, regular mayo/mustard  and cheese.  A Salad using blue cheese, ranch,  Goddess or vinagrette,  Avoid taco shells, croutons or "confetti" and no "candied nuts" but regular nuts OK.   No pretzels, nabs  or chips.  Pickles and miniature sweet peppers are a good low carb alternative that provide a "crunch"  The bread is the only source of carbohydrate in a sandwich and  can be decreased by trying some of the attached alternatives to traditional loaf bread   Avoid "Low fat dressings, as well as [place] and [place]  dressings They are loaded with sugar!   3 PM/ Mid day  Snack:  Consider  1 ounce of  almonds, walnuts, pistachios, pecans, peanuts,  Macadamia nuts or a nut medley.  Avoid "granola and granola bars "  Mixed nuts are ok in moderation as long as there are no raisins,  cranberries or dried fruit.   KIND bars are OK if you get the low glycemic index variety   Try the prosciutto/mozzarella cheese sticks by Fiorruci  In deli /backery section   High protein      6 PM  Dinner:     Meat/fowl/fish with a green salad, and either broccoli, cauliflower, green beans, spinach, brussel sprouts or  Lima beans. DO NOT BREAD THE PROTEIN!!      There is a low carb pasta by Dreamfield's that is acceptable and tastes great: only 5  digestible carbs/serving.( All grocery stores but BJs carry it ) Several ready made meals are available low carb:   Try Michel Angelo's chicken piccata or chicken or eggplant parm over low carb pasta.(Lowes and BJs)   Marjory Lies Sanchez's "Carnitas" (pulled pork, no sauce,  0 carbs) or his beef pot roast to make a dinner burrito (at BJ's)  Pesto over low carb pasta (bj's sells a good quality pesto in the center refrigerated section of the deli   Try satueeing  Cheral Marker with mushroooms as a good side   [employer] Giant makes a mashed cauliflower that tastes like mashed potatoes  Whole wheat pasta is still full of digestible carbs and  Not as low in glycemic index as Dreamfield's.   Brown rice is still rice,  So skip the rice and noodles if you eat [place] or [place] (or at least limit to 1/2 cup)  9 PM snack :   Breyer's "low carb" fudgsicle or  ice cream bar (Carb Smart line), or  Weight Watcher's ice cream bar , or another "no sugar added" ice cream;  a serving of fresh berries/cherries with whipped cream   Cheese or DANNON'S LlGHT N FIT GREEK YOGURT  8 ounces of [place] unsweetened almond/cococunut milk    Treat yourself to a parfait made with whipped cream blueberiies, walnuts and vanilla greek yogurt  Avoid bananas, pineapple, grapes  and watermelon on a regular basis because they are high in sugar.  THINK OF THEM AS DESSERT  Remember that snack Substitutions should be less than 10 NET carbs per serving and meals < 20 carbs. Remember to subtract fiber grams to get the "net carbs."  '@TULLOBREADPACKAGE' @

## 2018-06-17 NOTE — Progress Notes (Signed)
Subjective:    Patient ID: Emma Riggs, female    DOB: 08/05/1966, 52 y.o.   MRN: [id number]  CC: Emma Riggs is a 52 y.o. female who presents today for follow up.   HPI: Elevated blood pressure- never been on medications. Denies exertional chest pain or pressure, numbness or tingling radiating to left arm or jaw, palpitations, dizziness, frequent headaches, changes in vision, or shortness of breath.   Improving on exercise. Walks 15 minutes most days.    HISTORY:  Past Medical History:  Diagnosis Date  . Ringing in ear    Past Surgical History:  Procedure Laterality Date  . ABDOMINAL HYSTERECTOMY     Partial   Family History  Problem Relation Age of Onset  . Stroke Mother   . Cancer Father        Lung Cancer    Allergies: Patient has no known allergies. Current Outpatient Medications on File Prior to Visit  Medication Sig Dispense Refill  . Multiple Vitamin (MULTIVITAMIN) tablet Take 1 tablet by mouth daily.     No current facility-administered medications on file prior to visit.     Social History   Tobacco Use  . Smoking status: Never Smoker  . Smokeless tobacco: Never Used  Substance Use Topics  . Alcohol use: Yes    Alcohol/week: 0.0 standard drinks    Comment: Rare  . Drug use: No    Review of Systems  Constitutional: Negative for chills and fever.  Respiratory: Negative for cough and shortness of breath.   Cardiovascular: Negative for chest pain and palpitations.  Gastrointestinal: Negative for nausea and vomiting.      Objective:    BP (!) 124/100 (BP Location: Left Arm, Patient Position: Sitting, Cuff Size: Normal)   Pulse 77   Temp 97.7 F (36.5 C) (Oral)   Resp 15   Ht 4\' 11"  (1.499 m)   Wt 204 lb 6 oz (92.7 kg)   SpO2 98%   BMI 41.28 kg/m  BP Readings from Last 3 Encounters:  06/17/18 (!) 124/100  12/10/17 112/88  06/17/17 (!) 134/96   Wt Readings from Last 3 Encounters:  06/17/18 204 lb 6 oz (92.7 kg)  12/10/17 202 lb 12.8  oz (92 kg)  06/17/17 196 lb 3.2 oz (89 kg)    Physical Exam  Constitutional: She appears well-developed and well-nourished.  Eyes: Conjunctivae are normal.  Cardiovascular: Normal rate, regular rhythm, normal heart sounds and normal pulses.  Pulmonary/Chest: Effort normal and breath sounds normal. She has no wheezes. She has no rhonchi. She has no rales.  Neurological: She is alert.  Skin: Skin is warm and dry.  Psychiatric: She has a normal mood and affect. Her speech is normal and behavior is normal. Thought content normal.  Vitals reviewed.      Assessment & Plan:   Problem List Items Addressed This Visit      Cardiovascular and Mediastinum   Essential hypertension    Elevated. Start amlodipine. Close follow up and patient will monitor BP at home.       Relevant Medications   amLODipine (NORVASC) 5 MG tablet     Other   Obesity - Primary    Discussed diet, exercise particularly as she is prediabetic. Referral to medical weight loss, nutrition. Will follow.       Relevant Orders   Amb Ref to Medical Weight Management   Referral to Nutrition and Diabetes Services       I am having Emma  Alberteen Riggs start on amLODipine. I am also having her maintain her multivitamin.   Meds ordered this encounter  Medications  . amLODipine (NORVASC) 5 MG tablet    Sig: Take 1 tablet (5 mg total) by mouth daily.    Dispense:  90 tablet    Refill:  3    Order Specific Question:   Supervising Provider    Answer:   Sherlene Shams [[id number]]    Return precautions given.   Risks, benefits, and alternatives of the medications and treatment plan prescribed today were discussed, and patient expressed understanding.   Education regarding symptom management and diagnosis given to patient on AVS.  Continue to follow with Emma Grana, FNP for routine health maintenance.   Emma Riggs and I agreed with plan.   Rennie Plowman, FNP

## 2018-06-17 NOTE — Assessment & Plan Note (Signed)
Elevated. Start amlodipine. Close follow up and patient will monitor BP at home.

## 2018-06-17 NOTE — Assessment & Plan Note (Signed)
Discussed diet, exercise particularly as she is prediabetic. Referral to medical weight loss, nutrition. Will follow.

## 2018-06-18 ENCOUNTER — Ambulatory Visit: Payer: 59 | Admitting: Family Medicine

## 2018-06-24 ENCOUNTER — Encounter (INDEPENDENT_AMBULATORY_CARE_PROVIDER_SITE_OTHER): Payer: Self-pay

## 2018-07-07 ENCOUNTER — Encounter: Payer: Self-pay | Admitting: Dietician

## 2018-07-07 ENCOUNTER — Encounter: Payer: 59 | Attending: Family | Admitting: Dietician

## 2018-07-07 ENCOUNTER — Ambulatory Visit (INDEPENDENT_AMBULATORY_CARE_PROVIDER_SITE_OTHER): Payer: Self-pay | Admitting: Family Medicine

## 2018-07-07 VITALS — Ht 59.0 in | Wt 203.6 lb

## 2018-07-07 DIAGNOSIS — E6609 Other obesity due to excess calories: Secondary | ICD-10-CM | POA: Diagnosis not present

## 2018-07-07 DIAGNOSIS — Z6841 Body Mass Index (BMI) 40.0 and over, adult: Secondary | ICD-10-CM | POA: Diagnosis not present

## 2018-07-07 DIAGNOSIS — I10 Essential (primary) hypertension: Secondary | ICD-10-CM | POA: Diagnosis not present

## 2018-07-07 DIAGNOSIS — Z713 Dietary counseling and surveillance: Secondary | ICD-10-CM | POA: Diagnosis not present

## 2018-07-07 DIAGNOSIS — R7303 Prediabetes: Secondary | ICD-10-CM | POA: Diagnosis not present

## 2018-07-07 NOTE — Progress Notes (Signed)
Medical Nutrition Therapy: Visit start time: 10:40  end time:11:40  Assessment:  Diagnosis: obesity Past medical history: hypertension; prediabetes per MD notes Psychosocial issues/ stress concerns:  None identified  Preferred learning method:  . Hands-on  Current weight:203.6 lbs Height: 59 in Medications, supplements: see list  Progress and evaluation:  Patient in for initial medical nutrition therapy appointment. She reports her weight has been stable in the past year. She gives a weight goal of 150 lbs. She began a goal of not eating after 7:00pm 2 months ago but states she has not lost weight.  She eats most of her lunch and dinner meals "out" often making high fat, high sodium choices. She drinks sugar sweetened beverages for most lunch and dinner meals. She does very little cooking. She is eating fruit for snacks. Her overall diet is low in fruits, vegetables and whole grains.   Physical activity:none; has a gym membership but doesn't use it.  Dietary Intake:  Usual eating pattern includes 3 meals and 1-2 snacks per day. Dining out frequency: 11 meals per week.  Breakfast: 11:00- fruit, peanut butter/crackers Snack: fruit Lunch: [employer] fried chicken meal, taco bell's chicken burrito with nachos, [place] buffet; occasionally eats at home-Ex. raviolli Supper: 6-7:00pmsame as lunch; Seafood - fried seafood, baked potato, hush-puppies,  sweet tea Beverages: 2-3 cups water; 4-5 cups of soda or tea  Nutrition Care Education:   Weight control/prediabetes: Instructed on a meal plan based on 1300-1400 calories including carbohydrate counting, portion control and how to better balance carbohydrate, protein, and non-starchy vegetables. Used food models and food guide plate to show portions and balance. Used website to show her the calories and sodium in some of the meals she is eating when "out". Gave and reviewed "Quick and Healthy Meals" handout to suggest simple meals that could  be prepared at home.  Explained how a healthier diet and exercise can help decrease risk of diabetes.  Encouraged to use meal plan as a guide to become more mindful of food choices and meal pattern verses a focus on restriction. Encouraged to focus on foods needed to be added to meet nutrient needs such as vegetables and fruits.  Hypertension:  importance of controlling BP, identifying high sodium foods,  Gave and reviewed list of food products and lower sodium alternatives. Showed her how to look up sodium content of some of the foods she chooses when eating "out".  Nutritional Diagnosis:  NI-1.5 Excessive energy intake As related to frequent dining out, making high fat, high calorie selections as well as calories through sweetened beverages.  As evidenced by diet history..  Intervention:  Establish a consistent meal pattern of 3 meals spaced 4-5 hours apart. Balance meals with protein, 1-2 servings of starch, free vegetables. Can add a fruit serving to any meal. Try to increase intake of "free vegetables". If hungry, include one snack between meals. Use snack handout for suggestions. Prepare more meals at home. Go home for lunch verses eating "out". Use "Quick and Healthy Meals" for meal suggestions. Drink more water.Increase to 6 cups daily. Decrease soda or tea to 3 times per week.  Education Materials given:  . General diet guidelines for Hypertension . Plate Planner . Food lists/ Planning A Balanced Meal . Sample meal pattern/ menus . Snacking handout . Quick and Healthy Meals . Goals/ instructions  Learner/ who was taught:  . Patient  Level of understanding: . Partial understanding; needs review/ practice Demonstrated degree of understanding via:   Teach back Learning barriers: .  None Willingness to learn/ readiness for change: . Acceptance, ready for change  Monitoring and Evaluation:   Patient did not schedule a follow-up appointment. I encouraged her to call if she  desires additional help with her diet/nutrition.

## 2018-07-07 NOTE — Patient Instructions (Signed)
Establish a consistent meal pattern of 3 meals spaced 4-5 hours apart. Balance meals with protein, 1-2 servings of starch, free vegetables. Can add a fruit serving to any meal. Try to increase intake of "free vegetables". If hungry, include one snack between meals. Use snack handout for suggestions. Prepare more meals at home. Go home for lunch verses eating "out". Use "Quick and Healthy Meals" for meal suggestions. Drink more water.Increase to 6 cups daily. Decrease soda or tea to 3 times per week.

## 2018-07-22 ENCOUNTER — Ambulatory Visit (INDEPENDENT_AMBULATORY_CARE_PROVIDER_SITE_OTHER): Payer: Self-pay | Admitting: Family Medicine

## 2018-08-21 ENCOUNTER — Ambulatory Visit: Payer: 59 | Admitting: Family

## 2018-08-21 ENCOUNTER — Encounter: Payer: Self-pay | Admitting: Family

## 2018-08-21 VITALS — BP 126/88 | HR 83 | Temp 97.7°F | Ht 59.0 in | Wt 204.0 lb

## 2018-08-21 DIAGNOSIS — Z1239 Encounter for other screening for malignant neoplasm of breast: Secondary | ICD-10-CM

## 2018-08-21 DIAGNOSIS — Z1211 Encounter for screening for malignant neoplasm of colon: Secondary | ICD-10-CM | POA: Diagnosis not present

## 2018-08-21 DIAGNOSIS — I10 Essential (primary) hypertension: Secondary | ICD-10-CM

## 2018-08-21 MED ORDER — AMLODIPINE BESYLATE 5 MG PO TABS: 5.0000 mg | ORAL_TABLET | Freq: Every day | ORAL | 3 refills | Status: DC

## 2018-08-21 NOTE — Assessment & Plan Note (Signed)
Ordered, patient understands to schedule 

## 2018-08-21 NOTE — Patient Instructions (Signed)
Today we discussed referrals, orders. colonoscopy   I have placed these orders in the system for you.  Please be sure to give us a call if you have not heard from our office regarding this. We should hear from us within ONE week with information regarding your appointment. If not, please let me know immediately.    We placed a referral for mammogram this year. I asked that you call one the below locations and schedule this when it is convenient for you.   As discussed, I would like you to ask for 3D mammogram over the traditional 2D mammogram as new evidence suggest 3D is superior.   Please note that NOT all insurance companies cover 3D and you may have to pay a higher copay. You may call your insurance company to further clarify your benefits.   Options for Mammogram.    [facility]  [address]  [place], [place]  [phone]  * Offers [facility] mammogram [facility]*   [facility] [address] [place], [place] [phone] * Note if you ask for 3D mammogram at this location, you must request [place], [place] location*

## 2018-08-21 NOTE — Assessment & Plan Note (Signed)
Discussed limitations of Cologuard, patient and I felt more comfortable pursuing colonoscopy.  Referral placed.

## 2018-08-21 NOTE — Progress Notes (Signed)
Subjective:    Patient ID: Norman Clayonia M Shippey, female    DOB: 11/25/1965, 52 y.o.   MRN: [id number]  CC: Norman Clayonia M Dady is a 52 y.o. female who presents today for follow up.   HPI: Weight is unchanged.  Has been seeing nutritionist.  HTN- compliant with norvasc. Denies exertional chest pain or pressure, numbness or tingling radiating to left arm or jaw, palpitations, dizziness, frequent headaches, changes in vision, or shortness of breath.     Cologuard negative 2017.  HISTORY:  Past Medical History:  Diagnosis Date  . Ringing in ear    Past Surgical History:  Procedure Laterality Date  . ABDOMINAL HYSTERECTOMY     Partial   Family History  Problem Relation Age of Onset  . Stroke Mother   . Cancer Father        Lung Cancer    Allergies: Patient has no known allergies. Current Outpatient Medications on File Prior to Visit  Medication Sig Dispense Refill  . Multiple Vitamin (MULTIVITAMIN) tablet Take 1 tablet by mouth daily.     No current facility-administered medications on file prior to visit.     Social History   Tobacco Use  . Smoking status: Never Smoker  . Smokeless tobacco: Never Used  Substance Use Topics  . Alcohol use: Yes    Alcohol/week: 0.0 standard drinks    Comment: Rare  . Drug use: No    Review of Systems  Constitutional: Negative for chills and fever.  Respiratory: Negative for cough.   Cardiovascular: Negative for chest pain and palpitations.  Gastrointestinal: Negative for nausea and vomiting.      Objective:    BP 126/88   Pulse 83   Temp 97.7 F (36.5 C) (Oral)   Ht 4\' 11"  (1.499 m)   Wt 204 lb (92.5 kg)   SpO2 99%   BMI 41.20 kg/m  BP Readings from Last 3 Encounters:  08/21/18 126/88  06/17/18 (!) 124/100  12/10/17 112/88   Wt Readings from Last 3 Encounters:  08/21/18 204 lb (92.5 kg)  07/07/18 203 lb 9.6 oz (92.4 kg)  06/17/18 204 lb 6 oz (92.7 kg)    Physical Exam Vitals signs reviewed.  Constitutional:    Appearance: She is well-developed.  Eyes:     Conjunctiva/sclera: Conjunctivae normal.  Cardiovascular:     Rate and Rhythm: Normal rate and regular rhythm.     Pulses: Normal pulses.     Heart sounds: Normal heart sounds.  Pulmonary:     Effort: Pulmonary effort is normal.     Breath sounds: Normal breath sounds. No wheezing, rhonchi or rales.  Skin:    General: Skin is warm and dry.  Neurological:     Mental Status: She is alert.  Psychiatric:        Speech: Speech normal.        Behavior: Behavior normal.        Thought Content: Thought content normal.        Assessment & Plan:   Problem List Items Addressed This Visit      Cardiovascular and Mediastinum   Essential hypertension    At goal, continue current regimen      Relevant Medications   amLODipine (NORVASC) 5 MG tablet     Other   Screening for breast cancer - Primary    Ordered, patient understands to schedule      Relevant Orders   MM 3D SCREEN BREAST BILATERAL   Screen for colon cancer  Discussed limitations of Cologuard, patient and I felt more comfortable pursuing colonoscopy.  Referral placed.      Relevant Orders   Ambulatory referral to Gastroenterology       I am having Uilani M. Delford FieldWright maintain her multivitamin and amLODipine.   Meds ordered this encounter  Medications  . amLODipine (NORVASC) 5 MG tablet    Sig: Take 1 tablet (5 mg total) by mouth daily.    Dispense:  90 tablet    Refill:  3    Order Specific Question:   Supervising Provider    Answer:   Sherlene ShamsULLO, TERESA L [[id number]]    Return precautions given.   Risks, benefits, and alternatives of the medications and treatment plan prescribed today were discussed, and patient expressed understanding.   Education regarding symptom management and diagnosis given to patient on AVS.  Continue to follow with Allegra GranaArnett, Margaret G, FNP for routine health maintenance.   Norman Clayonia M Hassan and I agreed with plan.   Rennie PlowmanMargaret Arnett, FNP

## 2018-08-21 NOTE — Assessment & Plan Note (Signed)
At goal, continue current regimen 

## 2018-09-02 ENCOUNTER — Encounter: Payer: Self-pay | Admitting: Family

## 2018-09-14 ENCOUNTER — Encounter: Payer: Self-pay | Admitting: Family

## 2018-09-14 ENCOUNTER — Encounter: Payer: Self-pay | Admitting: *Deleted

## 2019-11-09 ENCOUNTER — Other Ambulatory Visit: Payer: Self-pay | Admitting: Family

## 2019-11-09 DIAGNOSIS — I10 Essential (primary) hypertension: Secondary | ICD-10-CM

## 2019-12-06 ENCOUNTER — Other Ambulatory Visit: Payer: Self-pay | Admitting: Family

## 2019-12-06 DIAGNOSIS — I10 Essential (primary) hypertension: Secondary | ICD-10-CM

## 2019-12-30 ENCOUNTER — Other Ambulatory Visit: Payer: Self-pay | Admitting: Family

## 2019-12-30 DIAGNOSIS — I10 Essential (primary) hypertension: Secondary | ICD-10-CM

## 2020-01-21 ENCOUNTER — Telehealth: Payer: Self-pay

## 2020-01-21 NOTE — Telephone Encounter (Signed)
Left message for patient to return call back. Patient needs to know she is due for cologuard and we needs to know if ok to reorder test. She will also need an appointment to follow.

## 2020-04-24 ENCOUNTER — Other Ambulatory Visit: Payer: Self-pay

## 2020-04-26 ENCOUNTER — Other Ambulatory Visit: Payer: Self-pay

## 2020-04-26 ENCOUNTER — Encounter: Payer: Self-pay | Admitting: Family

## 2020-04-26 ENCOUNTER — Ambulatory Visit: Payer: 59 | Admitting: Family

## 2020-04-26 VITALS — BP 140/87 | HR 78 | Temp 97.7°F | Ht 58.5 in | Wt 210.2 lb

## 2020-04-26 DIAGNOSIS — E6609 Other obesity due to excess calories: Secondary | ICD-10-CM | POA: Diagnosis not present

## 2020-04-26 DIAGNOSIS — Z1231 Encounter for screening mammogram for malignant neoplasm of breast: Secondary | ICD-10-CM

## 2020-04-26 DIAGNOSIS — I10 Essential (primary) hypertension: Secondary | ICD-10-CM | POA: Diagnosis not present

## 2020-04-26 DIAGNOSIS — E782 Mixed hyperlipidemia: Secondary | ICD-10-CM

## 2020-04-26 MED ORDER — AMLODIPINE BESYLATE 10 MG PO TABS: 10.0000 mg | ORAL_TABLET | Freq: Every day | ORAL | 1 refills | Status: DC

## 2020-04-26 MED ORDER — ROSUVASTATIN CALCIUM 10 MG PO TABS: 10.0000 mg | ORAL_TABLET | Freq: Every day | ORAL | 3 refills | Status: DC

## 2020-04-26 NOTE — Assessment & Plan Note (Signed)
Elevated cardiovascular risk. Family h/o CVA. Advised to start crestor 10mg  which patient agreeable. cmp in 6 weeks.

## 2020-04-26 NOTE — Patient Instructions (Signed)
Please call  and schedule your 3D mammogram as discussed.   [facility]  [address]  [place], [place]  [phone]  It is imperative that you are seen AT least twice per year for labs and monitoring. Monitor blood pressure at home and me 5-6 reading on separate days. Goal is less than 120/80, based on newest guidelines, however we certainly want to be less than 130/80;  if persistently higher, please make sooner follow up appointment so we can recheck you blood pressure and manage/ adjust medications.   Stay safe!

## 2020-04-26 NOTE — Assessment & Plan Note (Signed)
Discussed low carb diet. Patient will commit to exercise twice per week. Close follow up in 3 months to see how she is doing

## 2020-04-26 NOTE — Progress Notes (Signed)
Subjective:    Patient ID: Emma Riggs, female    DOB: 1966-01-14, 54 y.o.   MRN: [id number]  CC: [name] is a 54 y.o. female who presents today for follow up.   HPI: Feels well today  No complaints  HTN- at work recently had been 165/104. Compliant with norvasc. Denies exertional chest pain or pressure, numbness or tingling radiating to left arm or jaw, palpitations, dizziness, frequent headaches, changes in vision, or shortness of breath.    Has paperwork to be completed for [employer] due to BMI. Has gym membership and treadmill at home.      HISTORY:  Past Medical History:  Diagnosis Date  . Ringing in ear    Past Surgical History:  Procedure Laterality Date  . ABDOMINAL HYSTERECTOMY     Partial   Family History  Problem Relation Age of Onset  . Stroke Mother   . Cancer Father        Lung Cancer  . Colon cancer Neg Hx     Allergies: Patient has no known allergies. Current Outpatient Medications on File Prior to Visit  Medication Sig Dispense Refill  . Multiple Vitamin (MULTIVITAMIN) tablet Take 1 tablet by mouth daily. (Patient not taking: Reported on 04/26/2020)     No current facility-administered medications on file prior to visit.    Social History   Tobacco Use  . Smoking status: Never Smoker  . Smokeless tobacco: Never Used  Substance Use Topics  . Alcohol use: Yes    Alcohol/week: 0.0 standard drinks    Comment: Rare  . Drug use: No    Review of Systems  Constitutional: Negative for chills and fever.  Respiratory: Negative for cough.   Cardiovascular: Negative for chest pain and palpitations.  Gastrointestinal: Negative for nausea and vomiting.      Objective:    BP 140/87   Pulse 78   Temp 97.7 F (36.5 C)   Ht 4' 10.5" (1.486 m)   Wt 210 lb 3.2 oz (95.3 kg)   SpO2 99%   BMI 43.18 kg/m  BP Readings from Last 3 Encounters:  04/26/20 140/87  08/21/18 126/88  06/17/18 (!) 124/100   Wt Readings from Last 3 Encounters:    04/26/20 210 lb 3.2 oz (95.3 kg)  08/21/18 204 lb (92.5 kg)  07/07/18 203 lb 9.6 oz (92.4 kg)    Physical Exam Vitals reviewed.  Constitutional:      Appearance: She is well-developed.  Eyes:     Conjunctiva/sclera: Conjunctivae normal.  Cardiovascular:     Rate and Rhythm: Normal rate and regular rhythm.     Pulses: Normal pulses.     Heart sounds: Normal heart sounds.  Pulmonary:     Effort: Pulmonary effort is normal.     Breath sounds: Normal breath sounds. No wheezing, rhonchi or rales.  Skin:    General: Skin is warm and dry.  Neurological:     Mental Status: She is alert.  Psychiatric:        Speech: Speech normal.        Behavior: Behavior normal.        Thought Content: Thought content normal.        Assessment & Plan:   Problem List Items Addressed This Visit      Cardiovascular and Mediastinum   Essential hypertension    Elevated, increase norvasc to 10mg .       Relevant Medications   amLODipine (NORVASC) 10 MG tablet  rosuvastatin (CRESTOR) 10 MG tablet   Other Relevant Orders   Comprehensive metabolic panel     Other   Hyperlipemia - Primary    Elevated cardiovascular risk. Family h/o CVA. Advised to start crestor 10mg  which patient agreeable. cmp in 6 weeks.      Relevant Medications   amLODipine (NORVASC) 10 MG tablet   rosuvastatin (CRESTOR) 10 MG tablet   Obesity    Discussed low carb diet. Patient will commit to exercise twice per week. Close follow up in 3 months to see how she is doing      Screening for breast cancer   Relevant Orders   MM 3D SCREEN BREAST BILATERAL     Patient will schedule mammogram and return for cpe  I have changed Emma Riggs's amLODipine. I am also having her start on rosuvastatin. Additionally, I am having her maintain her multivitamin.   Meds ordered this encounter  Medications  . amLODipine (NORVASC) 10 MG tablet    Sig: Take 1 tablet (10 mg total) by mouth daily.    Dispense:  90 tablet     Refill:  1    Order Specific Question:   Supervising Provider    Answer:   L [[id number]]  . rosuvastatin (CRESTOR) 10 MG tablet    Sig: Take 1 tablet (10 mg total) by mouth daily.    Dispense:  90 tablet    Refill:  3    Order Specific Question:   Supervising Provider    Answer:   Duncan Dull [[id number]]    Return precautions given.   Risks, benefits, and alternatives of the medications and treatment plan prescribed today were discussed, and patient expressed understanding.   Education regarding symptom management and diagnosis given to patient on AVS.  Continue to follow with Sherlene Shams, FNP for routine health maintenance.   Allegra Grana and I agreed with plan.   Norman Clay, FNP

## 2020-04-26 NOTE — Assessment & Plan Note (Signed)
Elevated, increase norvasc to 10mg .

## 2020-05-30 ENCOUNTER — Encounter: Payer: Self-pay | Admitting: Family

## 2020-06-07 ENCOUNTER — Other Ambulatory Visit (INDEPENDENT_AMBULATORY_CARE_PROVIDER_SITE_OTHER): Payer: 59

## 2020-06-07 ENCOUNTER — Other Ambulatory Visit: Payer: Self-pay

## 2020-06-07 DIAGNOSIS — I10 Essential (primary) hypertension: Secondary | ICD-10-CM | POA: Diagnosis not present

## 2020-06-07 NOTE — Addendum Note (Signed)
Addended by: Hulan Fray on: 06/07/2020 11:23 AM   Modules accepted: Orders

## 2020-06-08 LAB — COMPREHENSIVE METABOLIC PANEL
ALT: 25 IU/L (ref 0–32)
AST: 25 IU/L (ref 0–40)
Albumin/Globulin Ratio: 1.6 (ref 1.2–2.2)
Albumin: 4.7 g/dL (ref 3.8–4.9)
Alkaline Phosphatase: 119 IU/L (ref 44–121)
BUN/Creatinine Ratio: 19 (ref 9–23)
BUN: 17 mg/dL (ref 6–24)
Bilirubin Total: 0.5 mg/dL (ref 0.0–1.2)
CO2: 23 mmol/L (ref 20–29)
Calcium: 9.9 mg/dL (ref 8.7–10.2)
Chloride: 103 mmol/L (ref 96–106)
Creatinine, Ser: 0.89 mg/dL (ref 0.57–1.00)
GFR calc Af Amer: 84 mL/min/{1.73_m2} (ref >59)
GFR calc non Af Amer: 73 mL/min/{1.73_m2} (ref >59)
Globulin, Total: 3 g/dL (ref 1.5–4.5)
Glucose: 113 mg/dL — ABNORMAL HIGH (ref 65–99)
Potassium: 4.5 mmol/L (ref 3.5–5.2)
Sodium: 142 mmol/L (ref 134–144)
Total Protein: 7.7 g/dL (ref 6.0–8.5)

## 2020-06-09 ENCOUNTER — Encounter: Payer: Self-pay | Admitting: Family

## 2020-06-23 ENCOUNTER — Encounter: Payer: Self-pay | Admitting: Family

## 2020-06-23 LAB — COLOGUARD: Cologuard: NEGATIVE

## 2020-07-03 ENCOUNTER — Telehealth: Payer: Self-pay | Admitting: Family

## 2020-07-03 DIAGNOSIS — E782 Mixed hyperlipidemia: Secondary | ICD-10-CM

## 2020-07-03 DIAGNOSIS — I10 Essential (primary) hypertension: Secondary | ICD-10-CM

## 2020-07-03 MED ORDER — AMLODIPINE BESYLATE 10 MG PO TABS: 10.0000 mg | ORAL_TABLET | Freq: Every day | ORAL | 1 refills | Status: DC

## 2020-07-03 MED ORDER — ROSUVASTATIN CALCIUM 10 MG PO TABS: 10.0000 mg | ORAL_TABLET | Freq: Every day | ORAL | 3 refills | Status: DC

## 2020-07-03 NOTE — Telephone Encounter (Signed)
Patient called in wanted all her medications moved to [facility] on [address] [employer] because her insurance will not payamLODipine (NORVASC) 10 MG tablet ,Multiple Vitamin (MULTIVITAMIN) tablet ,rosuvastatin (CRESTOR) 10 MG tablet

## 2020-07-07 LAB — COLOGUARD: Cologuard: NEGATIVE

## 2020-07-11 ENCOUNTER — Telehealth: Payer: Self-pay

## 2020-07-11 NOTE — Telephone Encounter (Signed)
LMTCB to let patient know that cologuard results were negative.

## 2020-07-11 NOTE — Telephone Encounter (Signed)
Pt returned call & informed of negative results.

## 2020-07-26 ENCOUNTER — Encounter: Payer: 59 | Admitting: Family

## 2020-08-02 ENCOUNTER — Ambulatory Visit
Admission: RE | Admit: 2020-08-02 | Discharge: 2020-08-02 | Disposition: A | Discharge status: Home / Self Care | Payer: 59 | Source: Ambulatory Visit | Attending: Family | Admitting: Family

## 2020-08-02 ENCOUNTER — Other Ambulatory Visit: Payer: Self-pay

## 2020-08-02 DIAGNOSIS — Z1231 Encounter for screening mammogram for malignant neoplasm of breast: Secondary | ICD-10-CM

## 2020-08-07 ENCOUNTER — Telehealth: Payer: Self-pay

## 2020-08-07 NOTE — Telephone Encounter (Signed)
LMTCB for mammogram results.  

## 2020-12-24 ENCOUNTER — Other Ambulatory Visit: Payer: Self-pay | Admitting: Family

## 2020-12-24 DIAGNOSIS — I10 Essential (primary) hypertension: Secondary | ICD-10-CM

## 2021-06-27 ENCOUNTER — Other Ambulatory Visit: Payer: Self-pay | Admitting: Family

## 2021-06-27 DIAGNOSIS — E782 Mixed hyperlipidemia: Secondary | ICD-10-CM

## 2021-07-03 ENCOUNTER — Other Ambulatory Visit: Payer: Self-pay | Admitting: Family

## 2021-07-03 DIAGNOSIS — I10 Essential (primary) hypertension: Secondary | ICD-10-CM

## 2021-12-28 ENCOUNTER — Other Ambulatory Visit: Payer: Self-pay | Admitting: Family

## 2021-12-28 DIAGNOSIS — I10 Essential (primary) hypertension: Secondary | ICD-10-CM

## 2022-01-16 ENCOUNTER — Ambulatory Visit: Payer: 59 | Admitting: Family

## 2022-01-16 ENCOUNTER — Other Ambulatory Visit: Payer: Self-pay

## 2022-01-16 ENCOUNTER — Encounter: Payer: Self-pay | Admitting: Family

## 2022-01-16 VITALS — BP 120/80 | HR 81 | Temp 97.9°F | Ht 59.0 in | Wt 219.2 lb

## 2022-01-16 DIAGNOSIS — Z1329 Encounter for screening for other suspected endocrine disorder: Secondary | ICD-10-CM

## 2022-01-16 DIAGNOSIS — Z0000 Encounter for general adult medical examination without abnormal findings: Secondary | ICD-10-CM

## 2022-01-16 DIAGNOSIS — E559 Vitamin D deficiency, unspecified: Secondary | ICD-10-CM

## 2022-01-16 DIAGNOSIS — E782 Mixed hyperlipidemia: Secondary | ICD-10-CM | POA: Diagnosis not present

## 2022-01-16 DIAGNOSIS — Z1211 Encounter for screening for malignant neoplasm of colon: Secondary | ICD-10-CM | POA: Diagnosis not present

## 2022-01-16 DIAGNOSIS — M25841 Other specified joint disorders, right hand: Secondary | ICD-10-CM

## 2022-01-16 DIAGNOSIS — I10 Essential (primary) hypertension: Secondary | ICD-10-CM

## 2022-01-16 DIAGNOSIS — Z1159 Encounter for screening for other viral diseases: Secondary | ICD-10-CM

## 2022-01-16 DIAGNOSIS — Z23 Encounter for immunization: Secondary | ICD-10-CM | POA: Diagnosis not present

## 2022-01-16 MED ORDER — NA SULFATE-K SULFATE-MG SULF 17.5-3.13-1.6 GM/177ML PO SOLN: 1.0000 | Freq: Once | ORAL | 0 refills | Status: AC

## 2022-01-16 MED ORDER — ROSUVASTATIN CALCIUM 10 MG PO TABS: 10.0000 mg | ORAL_TABLET | Freq: Every evening | ORAL | 3 refills | Status: DC

## 2022-01-16 MED ORDER — AMLODIPINE BESYLATE 10 MG PO TABS: 10.0000 mg | ORAL_TABLET | Freq: Every day | ORAL | 3 refills | Status: DC

## 2022-01-16 NOTE — Progress Notes (Signed)
Gastroenterology Pre-Procedure Review  Request Date: 02/13/2022 Requesting Physician: Dr. Allegra Lai   PATIENT REVIEW QUESTIONS: The patient responded to the following health history questions as indicated:    1. Are you having any GI issues? no 2. Do you have a personal history of Polyps? no 3. Do you have a family history of Colon Cancer or Polyps? no 4. Diabetes Mellitus? no 5. Joint replacements in the past 12 months?no 6. Major health problems in the past 3 months?no 7. Any artificial heart valves, MVP, or defibrillator?no    MEDICATIONS & ALLERGIES:    Patient reports the following regarding taking any anticoagulation/antiplatelet therapy:   Plavix, Coumadin, Eliquis, Xarelto, Lovenox, Pradaxa, Brilinta, or Effient? no Aspirin? no  Patient confirms/reports the following medications:  Current Outpatient Medications  Medication Sig Dispense Refill   amLODipine (NORVASC) 10 MG tablet Take 1 tablet (10 mg total) by mouth daily. 90 tablet 3   Multiple Vitamin (MULTIVITAMIN) tablet Take 1 tablet by mouth daily.     rosuvastatin (CRESTOR) 10 MG tablet Take 1 tablet (10 mg total) by mouth every evening. 90 tablet 3   No current facility-administered medications for this visit.    Patient confirms/reports the following allergies:  No Known Allergies  No orders of the defined types were placed in this encounter.   AUTHORIZATION INFORMATION Primary Insurance: 1D#: Group #:  Secondary Insurance: 1D#: Group #:  SCHEDULE INFORMATION: Date06/21/2023:  Time: Location:[facility]

## 2022-01-16 NOTE — Progress Notes (Signed)
Subjective:    Patient ID: Emma Riggs, female    DOB: 03-14-1966, 56 y.o.   MRN: [id number]  CC: Emma Riggs is a 56 y.o. female who presents today for follow up.   HPI: Right palm mass at base of ring finger several months ago Tender when presses  Noticed after a fall    HTN -Compliant with amlodipine 10 mg. No cp, sob. Hyperlipidemia-compliant with Crestor 10 mg  HISTORY:  Past Medical History:  Diagnosis Date   Ringing in ear    Past Surgical History:  Procedure Laterality Date   ABDOMINAL HYSTERECTOMY     Partial   Family History  Problem Relation Age of Onset   Stroke Mother    Cancer Father        Lung Cancer   Colon cancer Neg Hx     Allergies: Patient has no known allergies. Current Outpatient Medications on File Prior to Visit  Medication Sig Dispense Refill   Multiple Vitamin (MULTIVITAMIN) tablet Take 1 tablet by mouth daily.     No current facility-administered medications on file prior to visit.    Social History   Tobacco Use   Smoking status: Never   Smokeless tobacco: Never  Substance Use Topics   Alcohol use: Yes    Alcohol/week: 0.0 standard drinks    Comment: Rare   Drug use: No    Review of Systems  Constitutional:  Negative for chills and fever.  Respiratory:  Negative for cough.   Cardiovascular:  Negative for chest pain and palpitations.  Gastrointestinal:  Negative for nausea and vomiting.  Musculoskeletal:  Negative for joint swelling.     Objective:    BP 120/80 (BP Location: Right Arm, Patient Position: Sitting, Cuff Size: Normal)   Pulse 81   Temp 97.9 F (36.6 C) (Oral)   Ht '4\' 11"'  (1.499 m)   Wt 219 lb 3.2 oz (99.4 kg)   SpO2 98%   BMI 44.27 kg/m  BP Readings from Last 3 Encounters:  01/16/22 120/80  04/26/20 140/87  08/21/18 126/88   Wt Readings from Last 3 Encounters:  01/16/22 219 lb 3.2 oz (99.4 kg)  04/26/20 210 lb 3.2 oz (95.3 kg)  08/21/18 204 lb (92.5 kg)    Physical Exam Vitals reviewed.   Constitutional:      Appearance: She is well-developed.  Eyes:     Conjunctiva/sclera: Conjunctivae normal.  Cardiovascular:     Rate and Rhythm: Normal rate and regular rhythm.     Pulses: Normal pulses.     Heart sounds: Normal heart sounds.  Pulmonary:     Effort: Pulmonary effort is normal.     Breath sounds: Normal breath sounds. No wheezing, rhonchi or rales.  Musculoskeletal:       Hands:     Comments: Rubbery slightly tender mass appreciated base of right ring finger.  No erythema or fluctuance.  No increased heat  Skin:    General: Skin is warm and dry.  Neurological:     Mental Status: She is alert.  Psychiatric:        Speech: Speech normal.        Behavior: Behavior normal.        Thought Content: Thought content normal.       Assessment & Plan:   Problem List Items Addressed This Visit       Cardiovascular and Mediastinum   Essential hypertension    Stable.  Continue amlodipine 10 mg  Relevant Medications   amLODipine (NORVASC) 10 MG tablet   rosuvastatin (CRESTOR) 10 MG tablet   Other Relevant Orders   CBC w/Diff (Completed)   Comp Met (CMET) (Completed)     Musculoskeletal and Integument   Cyst of joint of right hand    Presentation consistent with ganglion cyst.  Pending ultrasound.  Patient politely declines seeing [facility], Dr. Peggye Ley at this time.       Relevant Orders   US SOFT TISSUE RT UPPER EXTREMITY LTD (NON-VASCULAR)     Other   Hyperlipemia    Excellent control.  Continue Crestor 10 mg       Relevant Medications   amLODipine (NORVASC) 10 MG tablet   rosuvastatin (CRESTOR) 10 MG tablet   Other Relevant Orders   Lipid panel (Completed)   Screen for colon cancer - Primary   Relevant Orders   Ambulatory referral to Gastroenterology   Other Visit Diagnoses     Vitamin D deficiency       Relevant Orders   Vitamin D (25 hydroxy) (Completed)   Thyroid disorder screening       Relevant Orders   TSH (Completed)    Encounter for hepatitis C screening test for low risk patient       Relevant Orders   Hepatitis C Antibody (Completed)   Encounter for annual physical exam       Relevant Orders   HIV antibody (with reflex) (Completed)   Need for tetanus booster       Relevant Orders   Tdap vaccine greater than or equal to 7yo IM (Completed)        I have changed Emma Riggs's amLODipine and rosuvastatin. I am also having her maintain her multivitamin.   Meds ordered this encounter  Medications   amLODipine (NORVASC) 10 MG tablet    Sig: Take 1 tablet (10 mg total) by mouth daily.    Dispense:  90 tablet    Refill:  3    Order Specific Question:   Supervising Provider    Answer:   Deborra Medina L [[id number]]   rosuvastatin (CRESTOR) 10 MG tablet    Sig: Take 1 tablet (10 mg total) by mouth every evening.    Dispense:  90 tablet    Refill:  3    Order Specific Question:   Supervising Provider    Answer:   Crecencio Mc [[id number]]    Return precautions given.   Risks, benefits, and alternatives of the medications and treatment plan prescribed today were discussed, and patient expressed understanding.   Education regarding symptom management and diagnosis given to patient on AVS.  Continue to follow with Emma Hawthorne, FNP for routine health maintenance.   Emma Riggs and I agreed with plan.   Emma Paris, FNP

## 2022-01-16 NOTE — Patient Instructions (Addendum)
Please call  and schedule your 3D mammogram and /or bone density scan as we discussed.   [facility]  ( new location in 2023)  [address], [place], [place] [address]  [place], [place]  [phone]   Ordered ultrasound of the right hand palm to evaluate if a ganglion cyst.  Please let me know if area were to become enlarged or more painful as I would encourage you to see Dr. Stephenie Acres, hand specialist at [facility].  Referral for Colonoscopy  Let us know if you dont hear back within a week in regards to an appointment being scheduled.

## 2022-01-17 LAB — COMPREHENSIVE METABOLIC PANEL
ALT: 18 U/L (ref 0–35)
AST: 23 U/L (ref 0–37)
Albumin: 4.8 g/dL (ref 3.5–5.2)
Alkaline Phosphatase: 125 U/L — ABNORMAL HIGH (ref 39–117)
BUN: 15 mg/dL (ref 6–23)
CO2: 25 mEq/L (ref 19–32)
Calcium: 10.3 mg/dL (ref 8.4–10.5)
Chloride: 102 mEq/L (ref 96–112)
Creatinine, Ser: 0.81 mg/dL (ref 0.40–1.20)
GFR: 81.02 mL/min (ref >60.00)
Glucose, Bld: 83 mg/dL (ref 70–99)
Potassium: 4.4 mEq/L (ref 3.5–5.1)
Sodium: 140 mEq/L (ref 135–145)
Total Bilirubin: 0.9 mg/dL (ref 0.2–1.2)
Total Protein: 8.2 g/dL (ref 6.0–8.3)

## 2022-01-17 LAB — CBC WITH DIFFERENTIAL/PLATELET
Basophils Absolute: 0 10*3/uL (ref 0.0–0.1)
Basophils Relative: 0.8 % (ref 0.0–3.0)
Eosinophils Absolute: 0.2 10*3/uL (ref 0.0–0.7)
Eosinophils Relative: 3 % (ref 0.0–5.0)
HCT: 40.8 % (ref 36.0–46.0)
Hemoglobin: 13.3 g/dL (ref 12.0–15.0)
Lymphocytes Relative: 32.9 % (ref 12.0–46.0)
Lymphs Abs: 1.7 10*3/uL (ref 0.7–4.0)
MCHC: 32.7 g/dL (ref 30.0–36.0)
MCV: 94.1 fl (ref 78.0–100.0)
Monocytes Absolute: 0.5 10*3/uL (ref 0.1–1.0)
Monocytes Relative: 10.4 % (ref 3.0–12.0)
Neutro Abs: 2.7 10*3/uL (ref 1.4–7.7)
Neutrophils Relative %: 52.9 % (ref 43.0–77.0)
Platelets: 439 10*3/uL — ABNORMAL HIGH (ref 150.0–400.0)
RBC: 4.33 Mil/uL (ref 3.87–5.11)
RDW: 13.7 % (ref 11.5–15.5)
WBC: 5.1 10*3/uL (ref 4.0–10.5)

## 2022-01-17 LAB — LIPID PANEL
Cholesterol: 167 mg/dL (ref 0–200)
HDL: 77.7 mg/dL (ref >39.00)
LDL Cholesterol: 72 mg/dL (ref 0–99)
NonHDL: 89.34
Total CHOL/HDL Ratio: 2
Triglycerides: 88 mg/dL (ref 0.0–149.0)
VLDL: 17.6 mg/dL (ref 0.0–40.0)

## 2022-01-17 LAB — VITAMIN D 25 HYDROXY (VIT D DEFICIENCY, FRACTURES): VITD: 9.67 ng/mL — ABNORMAL LOW (ref 30.00–100.00)

## 2022-01-17 LAB — HEPATITIS C ANTIBODY
Hepatitis C Ab: NONREACTIVE
SIGNAL TO CUT-OFF: 0.11 (ref <1.00)

## 2022-01-17 LAB — HIV ANTIBODY (ROUTINE TESTING W REFLEX): HIV 1&2 Ab, 4th Generation: NONREACTIVE

## 2022-01-17 LAB — TSH: TSH: 0.8 u[IU]/mL (ref 0.35–5.50)

## 2022-01-18 NOTE — Assessment & Plan Note (Signed)
Presentation consistent with ganglion cyst.  Pending ultrasound.  Patient politely declines seeing [facility], Dr. Stephenie Acres at this time.

## 2022-01-18 NOTE — Assessment & Plan Note (Signed)
Stable. Continue amlodipine 10mg 

## 2022-01-18 NOTE — Assessment & Plan Note (Signed)
Excellent control.  Continue Crestor 10 mg ?

## 2022-01-22 ENCOUNTER — Telehealth: Payer: Self-pay

## 2022-01-22 ENCOUNTER — Other Ambulatory Visit: Payer: Self-pay | Admitting: Family

## 2022-01-22 ENCOUNTER — Other Ambulatory Visit: Payer: Self-pay

## 2022-01-22 DIAGNOSIS — R748 Abnormal levels of other serum enzymes: Secondary | ICD-10-CM

## 2022-01-22 DIAGNOSIS — R899 Unspecified abnormal finding in specimens from other organs, systems and tissues: Secondary | ICD-10-CM

## 2022-01-22 DIAGNOSIS — E559 Vitamin D deficiency, unspecified: Secondary | ICD-10-CM

## 2022-01-22 MED ORDER — CHOLECALCIFEROL 1.25 MG (50000 UT) PO TABS: ORAL_TABLET | ORAL | 0 refills | Status: DC

## 2022-01-22 NOTE — Telephone Encounter (Signed)
I tried to call patient but was unable to reach VM was full.

## 2022-01-23 ENCOUNTER — Other Ambulatory Visit: Payer: Self-pay

## 2022-01-23 DIAGNOSIS — I10 Essential (primary) hypertension: Secondary | ICD-10-CM

## 2022-01-24 ENCOUNTER — Other Ambulatory Visit: Payer: Self-pay

## 2022-01-24 DIAGNOSIS — E559 Vitamin D deficiency, unspecified: Secondary | ICD-10-CM

## 2022-01-24 DIAGNOSIS — I10 Essential (primary) hypertension: Secondary | ICD-10-CM

## 2022-01-24 MED ORDER — CHOLECALCIFEROL 1.25 MG (50000 UT) PO TABS: ORAL_TABLET | ORAL | 0 refills | Status: DC

## 2022-01-25 ENCOUNTER — Ambulatory Visit
Admission: RE | Admit: 2022-01-25 | Discharge: 2022-01-25 | Disposition: A | Discharge status: Home / Self Care | Payer: 59 | Source: Ambulatory Visit | Attending: Family | Admitting: Family

## 2022-01-25 DIAGNOSIS — M25841 Other specified joint disorders, right hand: Secondary | ICD-10-CM | POA: Insufficient documentation

## 2022-02-13 ENCOUNTER — Ambulatory Visit: Payer: 59 | Admitting: Anesthesiology

## 2022-02-13 ENCOUNTER — Ambulatory Visit
Admission: RE | Admit: 2022-02-13 | Discharge: 2022-02-13 | Disposition: A | Discharge status: Home / Self Care | Payer: 59 | Attending: Gastroenterology | Admitting: Gastroenterology

## 2022-02-13 ENCOUNTER — Encounter
Admission: RE | Disposition: A | Discharge status: Home / Self Care | Payer: Self-pay | Source: Home / Self Care | Attending: Gastroenterology

## 2022-02-13 ENCOUNTER — Encounter: Payer: Self-pay | Admitting: Gastroenterology

## 2022-02-13 ENCOUNTER — Other Ambulatory Visit: Payer: Self-pay

## 2022-02-13 DIAGNOSIS — Z1211 Encounter for screening for malignant neoplasm of colon: Secondary | ICD-10-CM | POA: Diagnosis present

## 2022-02-13 DIAGNOSIS — I10 Essential (primary) hypertension: Secondary | ICD-10-CM | POA: Insufficient documentation

## 2022-02-13 DIAGNOSIS — Z6841 Body Mass Index (BMI) 40.0 and over, adult: Secondary | ICD-10-CM | POA: Diagnosis not present

## 2022-02-13 HISTORY — DX: Hyperlipidemia, unspecified: E78.5

## 2022-02-13 HISTORY — PX: COLONOSCOPY WITH PROPOFOL: SHX5780

## 2022-02-13 HISTORY — DX: Essential (primary) hypertension: I10

## 2022-02-13 SURGERY — COLONOSCOPY WITH PROPOFOL
Anesthesia: General

## 2022-02-13 MED ORDER — LIDOCAINE HCL (CARDIAC) PF 100 MG/5ML IV SOSY
PREFILLED_SYRINGE | INTRAVENOUS | Status: DC | PRN
  Administered 2022-02-13: 100 mg via INTRAVENOUS

## 2022-02-13 MED ORDER — DEXMEDETOMIDINE HCL IN NACL 200 MCG/50ML IV SOLN
INTRAVENOUS | Status: DC | PRN
  Administered 2022-02-13: 20 ug via INTRAVENOUS

## 2022-02-13 MED ORDER — LIDOCAINE HCL URETHRAL/MUCOSAL 2 % EX GEL
CUTANEOUS | Status: AC
  Filled 2022-02-13: qty 5

## 2022-02-13 MED ORDER — LIDOCAINE HCL (PF) 2 % IJ SOLN
INTRAMUSCULAR | Status: AC
  Filled 2022-02-13: qty 5

## 2022-02-13 MED ORDER — PHENYLEPHRINE HCL (PRESSORS) 10 MG/ML IV SOLN
INTRAVENOUS | Status: DC | PRN
  Administered 2022-02-13 (×2): 160 ug via INTRAVENOUS

## 2022-02-13 MED ORDER — SODIUM CHLORIDE 0.9 % IV SOLN
INTRAVENOUS | Status: DC
  Administered 2022-02-13: 1000 mL via INTRAVENOUS

## 2022-02-13 MED ORDER — MIDAZOLAM HCL 2 MG/2ML IJ SOLN
INTRAMUSCULAR | Status: DC | PRN
  Administered 2022-02-13 (×2): 1 mg via INTRAVENOUS

## 2022-02-13 MED ORDER — STERILE WATER FOR IRRIGATION IR SOLN
Status: DC | PRN
  Administered 2022-02-13: 60 mL

## 2022-02-13 MED ORDER — LIDOCAINE HCL (PF) 1 % IJ SOLN
INTRAMUSCULAR | Status: AC
  Filled 2022-02-13: qty 2

## 2022-02-13 MED ORDER — PROPOFOL 500 MG/50ML IV EMUL
INTRAVENOUS | Status: DC | PRN
  Administered 2022-02-13: 100 ug/kg/min via INTRAVENOUS

## 2022-02-13 MED ORDER — FENTANYL CITRATE (PF) 100 MCG/2ML IJ SOLN
INTRAMUSCULAR | Status: AC
  Filled 2022-02-13: qty 2

## 2022-02-13 MED ORDER — MIDAZOLAM HCL 2 MG/2ML IJ SOLN
INTRAMUSCULAR | Status: AC
  Filled 2022-02-13: qty 2

## 2022-02-13 MED ORDER — PHENYLEPHRINE 80 MCG/ML (10ML) SYRINGE FOR IV PUSH (FOR BLOOD PRESSURE SUPPORT)
PREFILLED_SYRINGE | INTRAVENOUS | Status: AC
  Filled 2022-02-13: qty 10

## 2022-02-13 MED ORDER — PROPOFOL 1000 MG/100ML IV EMUL
INTRAVENOUS | Status: AC
  Filled 2022-02-13: qty 100

## 2022-02-13 MED ORDER — PROPOFOL 10 MG/ML IV BOLUS
INTRAVENOUS | Status: DC | PRN
  Administered 2022-02-13: 50 mg via INTRAVENOUS

## 2022-02-13 NOTE — Anesthesia Preprocedure Evaluation (Signed)
Anesthesia Evaluation  Patient identified by MRN, date of birth, ID band Patient awake    Reviewed: Allergy & Precautions, H&P , NPO status , Patient's Chart, lab work & pertinent test results, reviewed documented beta blocker date and time   Airway Mallampati: II   Neck ROM: full    Dental  (+) Poor Dentition   Pulmonary neg pulmonary ROS,    Pulmonary exam normal        Cardiovascular Exercise Tolerance: Good hypertension, On Medications negative cardio ROS Normal cardiovascular exam Rhythm:regular Rate:Normal     Neuro/Psych negative neurological ROS  negative psych ROS   GI/Hepatic negative GI ROS, Neg liver ROS,   Endo/Other  Morbid obesity  Renal/GU negative Renal ROS  negative genitourinary   Musculoskeletal   Abdominal   Peds  Hematology negative hematology ROS (+)   Anesthesia Other Findings Past Medical History: No date: Hyperlipemia No date: Hypertension No date: Ringing in ear Past Surgical History: No date: ABDOMINAL HYSTERECTOMY     Comment:  Partial BMI    Body Mass Index: 44.43 kg/m     Reproductive/Obstetrics negative OB ROS                             Anesthesia Physical Anesthesia Plan  ASA: 3  Anesthesia Plan: General   Post-op Pain Management:    Induction:   PONV Risk Score and Plan:   Airway Management Planned:   Additional Equipment:   Intra-op Plan:   Post-operative Plan:   Informed Consent: I have reviewed the patients History and Physical, chart, labs and discussed the procedure including the risks, benefits and alternatives for the proposed anesthesia with the patient or authorized representative who has indicated his/her understanding and acceptance.     Dental Advisory Given  Plan Discussed with: CRNA  Anesthesia Plan Comments:         Anesthesia Quick Evaluation

## 2022-02-13 NOTE — Transfer of Care (Signed)
Immediate Anesthesia Transfer of Care Note  Patient: Emma Riggs  Procedure(s) Performed: COLONOSCOPY WITH PROPOFOL  Patient Location: PACU  Anesthesia Type:General  Level of Consciousness: sedated  Airway & Oxygen Therapy: Patient Spontanous Breathing and Patient connected to nasal cannula oxygen  Post-op Assessment: Report given to RN and Post -op Vital signs reviewed and stable  Post vital signs: Reviewed and stable  Last Vitals:  Vitals Value Taken Time  BP 88/51 02/13/22 0849  Temp    Pulse 75 02/13/22 0850  Resp 19 02/13/22 0850  SpO2 99 % 02/13/22 0850  Vitals shown include unvalidated device data.  Last Pain:  Vitals:   02/13/22 0849  TempSrc:   PainSc: 0-No pain         Complications: No notable events documented.

## 2022-02-13 NOTE — Anesthesia Postprocedure Evaluation (Signed)
Anesthesia Post Note  Patient: Emma Riggs  Procedure(s) Performed: COLONOSCOPY WITH PROPOFOL  Patient location during evaluation: PACU Anesthesia Type: General Level of consciousness: awake and alert Pain management: pain level controlled Vital Signs Assessment: post-procedure vital signs reviewed and stable Respiratory status: spontaneous breathing, nonlabored ventilation, respiratory function stable and patient connected to nasal cannula oxygen Cardiovascular status: blood pressure returned to baseline and stable Postop Assessment: no apparent nausea or vomiting Anesthetic complications: no   No notable events documented.   Last Vitals:  Vitals:   02/13/22 0846 02/13/22 0849  BP:    Pulse: 75   Resp: (!) 21   Temp:  (!) 35.6 C  SpO2: 98%     Last Pain:  Vitals:   02/13/22 0853  TempSrc:   PainSc: 0-No pain                 Yevette Edwards

## 2022-02-13 NOTE — Op Note (Signed)
[facility] Patient Name: Emma Riggs Procedure Date: 02/13/2022 8:11 AM MRN: [id number] Account #: [id number] Date of Birth: 08/20/1966 Admit Type: Outpatient Age: 56 Room: [facility] ENDO ROOM 4 Gender: Female Note Status: Finalized Instrument Name: Prentice Docker [id number] Procedure:             Colonoscopy Indications:           Screening for colorectal malignant neoplasm, This is                         the patient's first colonoscopy Providers:             Toney Reil MD, MD Referring MD:          Lyn Records. Arnett (Referring MD) Medicines:             General Anesthesia Complications:         No immediate complications. Estimated blood loss: None. Procedure:             Pre-Anesthesia Assessment:                        - Prior to the procedure, a History and Physical was                         performed, and patient medications and allergies were                         reviewed. The patient is competent. The risks and                         benefits of the procedure and the sedation options and                         risks were discussed with the patient. All questions                         were answered and informed consent was obtained.                         Patient identification and proposed procedure were                         verified by the physician, the nurse, the                         anesthesiologist, the anesthetist and the technician                         in the pre-procedure area in the procedure room in the                         endoscopy suite. Mental Status Examination: alert and                         oriented. Airway Examination: normal oropharyngeal                         airway and neck mobility. Respiratory Examination:  clear to auscultation. CV Examination: normal.                         Prophylactic Antibiotics: The patient does not require                         prophylactic  antibiotics. Prior Anticoagulants: The                         patient has taken no previous anticoagulant or                         antiplatelet agents. ASA Grade Assessment: III - A                         patient with severe systemic disease. After reviewing                         the risks and benefits, the patient was deemed in                         satisfactory condition to undergo the procedure. The                         anesthesia plan was to use general anesthesia.                         Immediately prior to administration of medications,                         the patient was re-assessed for adequacy to receive                         sedatives. The heart rate, respiratory rate, oxygen                         saturations, blood pressure, adequacy of pulmonary                         ventilation, and response to care were monitored                         throughout the procedure. The physical status of the                         patient was re-assessed after the procedure.                        After obtaining informed consent, the colonoscope was                         passed under direct vision. Throughout the procedure,                         the patient's blood pressure, pulse, and oxygen                         saturations were monitored continuously. The  Colonoscope was introduced through the anus and                         advanced to the the cecum, identified by appendiceal                         orifice and ileocecal valve. The colonoscopy was                         unusually difficult due to restricted mobility of the                         colon. Successful completion of the procedure was                         aided by withdrawing the scope and replacing with the                         pediatric colonoscope. Findings:      Pediatric colonoscope was used due to tight rectosigmoid area from prior       hysterectomy      The  perianal and digital rectal examinations were normal. Pertinent       negatives include normal sphincter tone and no palpable rectal lesions.      The entire examined colon appeared normal.      The retroflexed view of the distal rectum and anal verge was normal and       showed no anal or rectal abnormalities. Impression:            - The entire examined colon is normal.                        - The distal rectum and anal verge are normal on                         retroflexion view.                        - No specimens collected. Recommendation:        - Discharge patient to home (with escort).                        - Resume previous diet today.                        - Continue present medications.                        - Repeat colonoscopy in 10 years for screening                         purposes. Procedure Code(s):     --- Professional ---                        T2671, Colorectal cancer screening; colonoscopy on                         individual not meeting criteria for high risk Diagnosis Code(s):     --- Professional ---  Z12.11, Encounter for screening for malignant neoplasm                         of colon CPT copyright 2019 American Medical Association. All rights reserved. The codes documented in this report are preliminary and upon coder review may  be revised to meet current compliance requirements. Dr. Ulyess Mort Lin Landsman MD, MD 02/13/2022 8:44:11 AM This report has been signed electronically. Number of Addenda: 0 Note Initiated On: 02/13/2022 8:11 AM Scope Withdrawal Time: 0 hours 5 minutes 31 seconds  Total Procedure Duration: 0 hours 14 minutes 6 seconds  Estimated Blood Loss:  Estimated blood loss: none.      [facility]

## 2022-02-13 NOTE — H&P (Signed)
Arlyss Repress, MD [address]  [address]  [place], [place] [address]  Main: [phone]  Fax: [phone] Pager: [phone]  Primary Care Physician:  Allegra Grana, FNP Primary Gastroenterologist:  Dr. Arlyss Repress  Pre-Procedure History & Physical: HPI:  Emma Riggs is a 56 y.o. female is here for an colonoscopy.   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 Authorizing Provider  amLODipine (NORVASC) 10 MG tablet Take 1 tablet (10 mg total) by mouth daily. 01/16/22  Yes Allegra Grana, FNP  Cholecalciferol 1.25 MG (50000 UT) TABS 50,000 units PO qwk for 8 weeks. 01/24/22  Yes Allegra Grana, FNP  Multiple Vitamin (MULTIVITAMIN) tablet Take 1 tablet by mouth daily.   Yes [provider]  rosuvastatin (CRESTOR) 10 MG tablet Take 1 tablet (10 mg total) by mouth every evening. 01/16/22  Yes Arnett, Lyn Records, FNP    Allergies as of 01/16/2022   (No Known Allergies)    Family History  Problem Relation Age of Onset   Stroke Mother    Cancer Father        Lung Cancer   Colon cancer Neg Hx     Social History   Socioeconomic History   Marital status: Single    Spouse name: Not on file   Number of children: Not on file   Years of education: Not on file   Highest education level: Not on file  Occupational History   Not on file  Tobacco Use   Smoking status: Never   Smokeless tobacco: Never  Vaping Use   Vaping Use: Never used  Substance and Sexual Activity   Alcohol use: Yes    Alcohol/week: 0.0 standard drinks of alcohol    Comment: Rare   Drug use: No   Sexual activity: Never  Other Topics Concern   Not on file  Social History Narrative   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    Single    Bachelors degree   Caffeine- coffee 1 cup winter, sodas- every other day 1 cup or less, sweet tea- 1 cup occasionally       Exercise- just started walking again; going to gym    Social Determinants of Health   Financial Resource Strain: Not on file  Food Insecurity: Not on file  Transportation Needs: Not on file  Physical Activity: Not on file  Stress: Not on file  Social Connections: Not on file  Intimate Partner Violence: Not on file    Review of Systems: See HPI, otherwise negative ROS  Physical Exam: BP 138/81   Pulse 93   Temp (!) 97 F (36.1 C) (Temporal)   Resp 20   Ht 4\' 11"  (1.499 m)   Wt 99.8 kg   SpO2 100%   BMI 44.43 kg/m  General:   Alert,  pleasant and cooperative in NAD Head:  Normocephalic and atraumatic. Neck:  Supple; no masses or thyromegaly. Lungs:  Clear throughout to auscultation.    Heart:  Regular rate and rhythm. Abdomen:  Soft, nontender and nondistended. Normal bowel sounds, without guarding, and without rebound.   Neurologic:  Alert and  oriented x4;  grossly  normal neurologically.  Impression/Plan: Emma Riggs is here for an colonoscopy to be performed for colon cancer screening  Risks, benefits, limitations, and alternatives regarding  colonoscopy have been reviewed with the patient.  Questions have been answered.  All parties agreeable.   Lannette Donath, MD  02/13/2022, 7:45 AM

## 2022-02-15 ENCOUNTER — Encounter: Payer: Self-pay | Admitting: Gastroenterology

## 2022-02-17 ENCOUNTER — Other Ambulatory Visit: Payer: Self-pay | Admitting: Family

## 2022-02-17 DIAGNOSIS — E559 Vitamin D deficiency, unspecified: Secondary | ICD-10-CM

## 2022-02-27 ENCOUNTER — Other Ambulatory Visit (INDEPENDENT_AMBULATORY_CARE_PROVIDER_SITE_OTHER): Payer: 59

## 2022-02-27 DIAGNOSIS — I10 Essential (primary) hypertension: Secondary | ICD-10-CM

## 2022-02-27 DIAGNOSIS — R748 Abnormal levels of other serum enzymes: Secondary | ICD-10-CM | POA: Diagnosis not present

## 2022-02-27 LAB — CBC WITH DIFFERENTIAL/PLATELET
Basophils Absolute: 0.1 10*3/uL (ref 0.0–0.1)
Basophils Relative: 1.3 % (ref 0.0–3.0)
Eosinophils Absolute: 0.2 10*3/uL (ref 0.0–0.7)
Eosinophils Relative: 3.7 % (ref 0.0–5.0)
HCT: 40.2 % (ref 36.0–46.0)
Hemoglobin: 13.1 g/dL (ref 12.0–15.0)
Lymphocytes Relative: 32.8 % (ref 12.0–46.0)
Lymphs Abs: 1.5 10*3/uL (ref 0.7–4.0)
MCHC: 32.4 g/dL (ref 30.0–36.0)
MCV: 95.1 fl (ref 78.0–100.0)
Monocytes Absolute: 0.6 10*3/uL (ref 0.1–1.0)
Monocytes Relative: 12.5 % — ABNORMAL HIGH (ref 3.0–12.0)
Neutro Abs: 2.3 10*3/uL (ref 1.4–7.7)
Neutrophils Relative %: 49.7 % (ref 43.0–77.0)
Platelets: 384 10*3/uL (ref 150.0–400.0)
RBC: 4.23 Mil/uL (ref 3.87–5.11)
RDW: 13.8 % (ref 11.5–15.5)
WBC: 4.7 10*3/uL (ref 4.0–10.5)

## 2022-02-27 LAB — GAMMA GT: GGT: 17 U/L (ref 7–51)

## 2022-02-28 ENCOUNTER — Encounter: Payer: Self-pay | Admitting: *Deleted

## 2022-03-02 LAB — ALKALINE PHOSPHATASE, ISOENZYMES
Alkaline Phosphatase: 137 IU/L — ABNORMAL HIGH (ref 44–121)
BONE FRACTION: 45 % (ref 14–68)
INTESTINAL FRAC.: 4 % (ref 0–18)
LIVER FRACTION: 51 % (ref 18–85)

## 2022-03-08 ENCOUNTER — Other Ambulatory Visit: Payer: Self-pay | Admitting: Family

## 2022-03-08 DIAGNOSIS — R748 Abnormal levels of other serum enzymes: Secondary | ICD-10-CM

## 2022-03-22 ENCOUNTER — Telehealth: Payer: Self-pay

## 2022-03-22 ENCOUNTER — Other Ambulatory Visit: Payer: Self-pay

## 2022-03-22 ENCOUNTER — Other Ambulatory Visit: Payer: Self-pay | Admitting: Family

## 2022-03-22 DIAGNOSIS — E559 Vitamin D deficiency, unspecified: Secondary | ICD-10-CM

## 2022-03-22 MED ORDER — VITAMIN D3 1.25 MG (50000 UT) PO CAPS: ORAL_CAPSULE | ORAL | 1 refills | Status: DC

## 2022-03-22 NOTE — Telephone Encounter (Signed)
VM was full so I was unable to leave a message

## 2022-04-21 IMAGING — MG DIGITAL SCREENING BILAT W/ TOMO W/ CAD
8 series · 8 of 24 positions shown · non-contrast
Comparison: Previous exam(s).

CLINICAL DATA: Screening.

EXAM:
DIGITAL SCREENING BILATERAL MAMMOGRAM WITH TOMO AND CAD

[L MLO synth-2D]
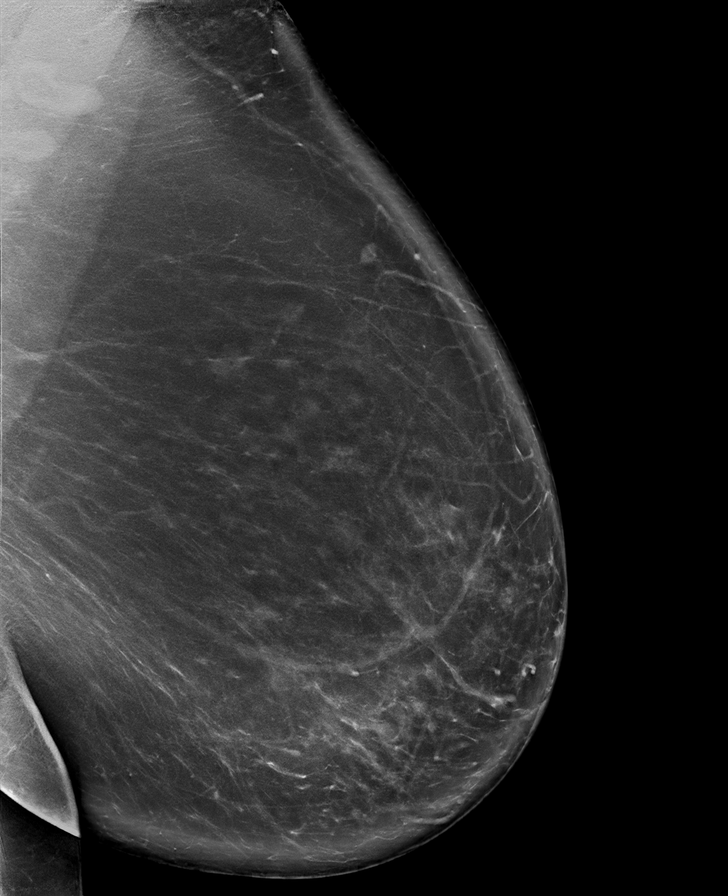

[R CC synth-2D]
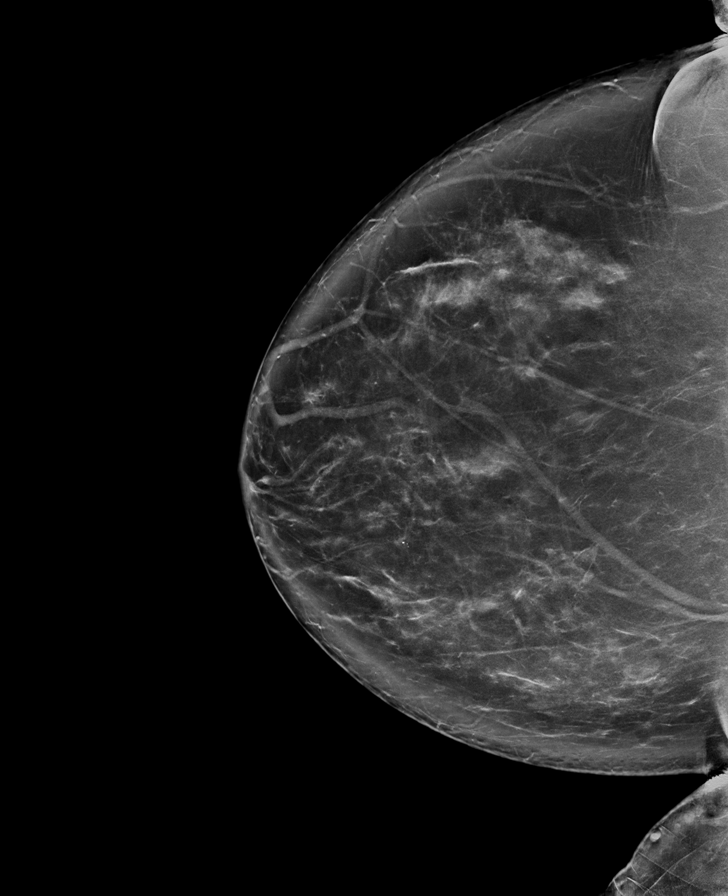

[L CC synth-2D]
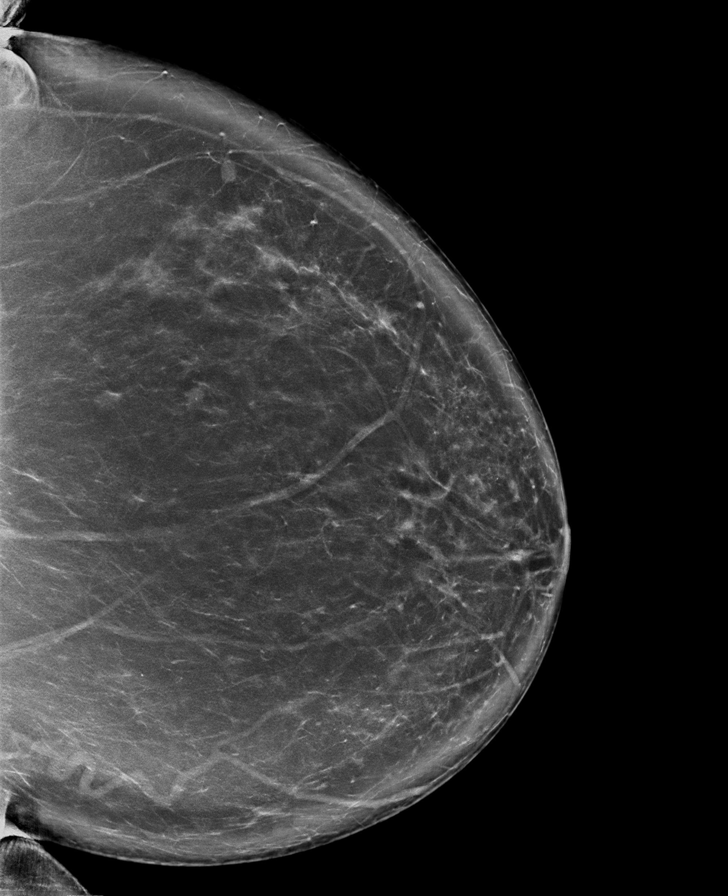

[R MLO synth-2D]
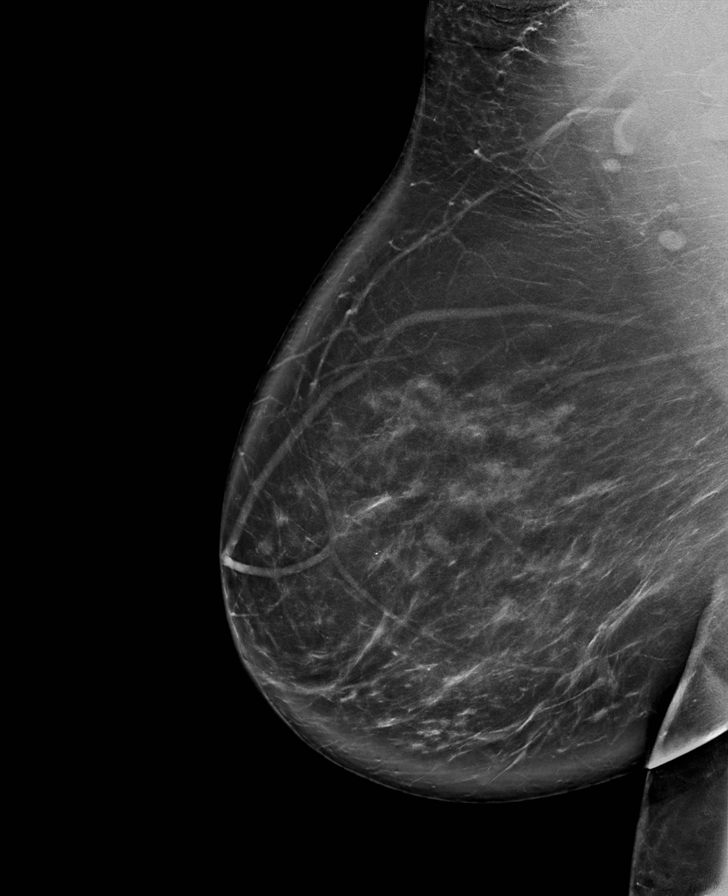

[R MLO tomo · tomo slice 55/108.0]
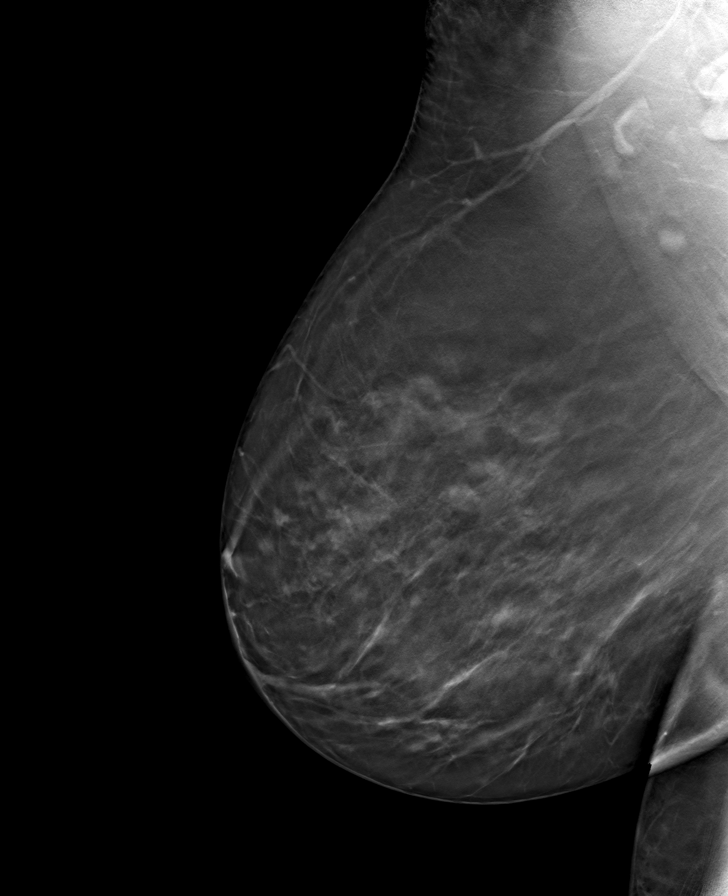

[R CC tomo · tomo slice 52/103.0]
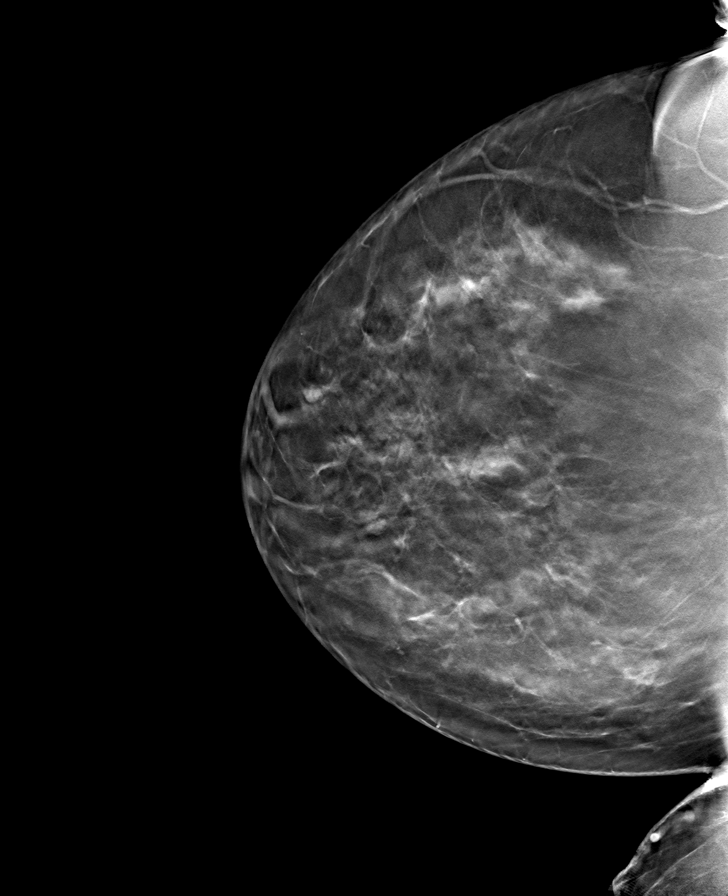

[L CC tomo · tomo slice 55/108.0]
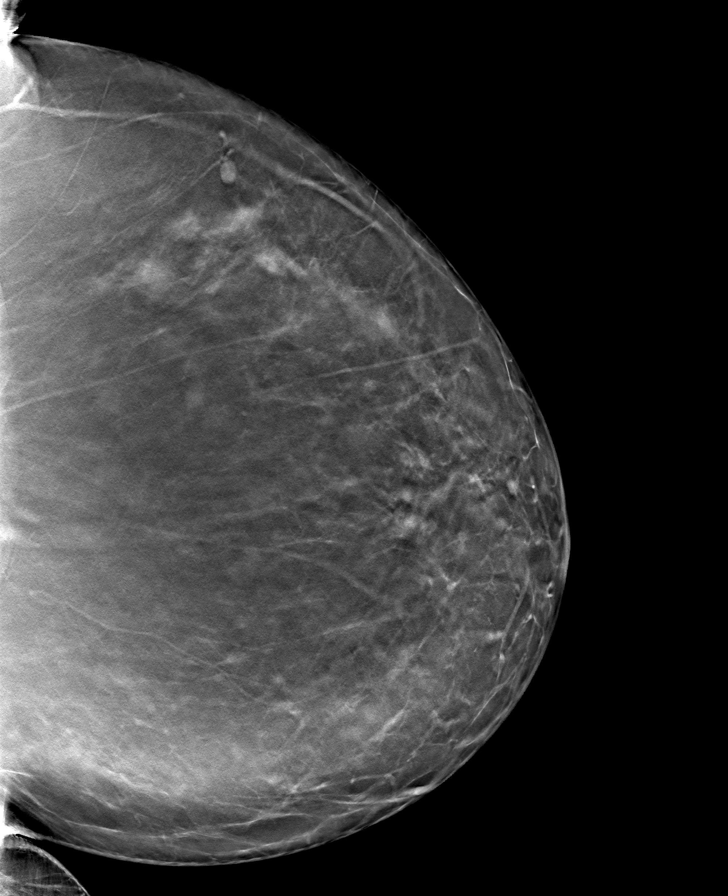

[L MLO tomo · tomo slice 59/118.0]
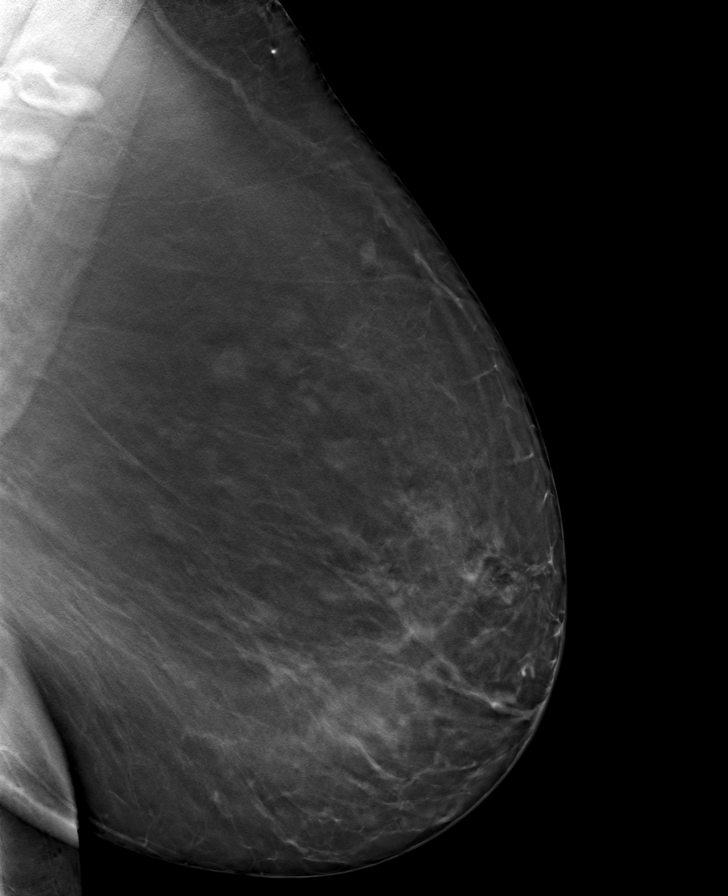

[8 of 24 positions shown; findings below may reference images not displayed]

ACR Breast Density Category b: There are scattered areas of
fibroglandular density.
FINDINGS: There are no findings suspicious for malignancy. Images were
processed with CAD.
IMPRESSION: No mammographic evidence of malignancy. A result letter of this
screening mammogram will be mailed directly to the patient.

RECOMMENDATION:
Screening mammogram in one year. (Code:[id number])

BI-RADS CATEGORY  1: Negative.

## 2022-05-01 ENCOUNTER — Ambulatory Visit
Admission: RE | Admit: 2022-05-01 | Discharge: 2022-05-01 | Disposition: A | Discharge status: Home / Self Care | Payer: 59 | Source: Ambulatory Visit | Attending: Family | Admitting: Family

## 2022-05-01 DIAGNOSIS — R748 Abnormal levels of other serum enzymes: Secondary | ICD-10-CM | POA: Diagnosis not present

## 2022-05-06 ENCOUNTER — Encounter: Payer: Self-pay | Admitting: Family

## 2022-05-06 DIAGNOSIS — R748 Abnormal levels of other serum enzymes: Secondary | ICD-10-CM | POA: Insufficient documentation

## 2022-06-30 ENCOUNTER — Other Ambulatory Visit: Payer: Self-pay | Admitting: Family

## 2022-06-30 DIAGNOSIS — E782 Mixed hyperlipidemia: Secondary | ICD-10-CM

## 2022-12-30 ENCOUNTER — Encounter: Payer: Self-pay | Admitting: Family

## 2022-12-30 ENCOUNTER — Ambulatory Visit: Payer: 59 | Admitting: Family

## 2022-12-30 ENCOUNTER — Other Ambulatory Visit (HOSPITAL_COMMUNITY)
Admission: RE | Admit: 2022-12-30 | Discharge: 2022-12-30 | Disposition: A | Discharge status: Home / Self Care | Payer: 59 | Source: Ambulatory Visit | Attending: Family | Admitting: Family

## 2022-12-30 VITALS — BP 138/78 | HR 82 | Temp 97.9°F | Ht 59.0 in | Wt 225.0 lb

## 2022-12-30 DIAGNOSIS — I10 Essential (primary) hypertension: Secondary | ICD-10-CM | POA: Diagnosis not present

## 2022-12-30 DIAGNOSIS — Z8639 Personal history of other endocrine, nutritional and metabolic disease: Secondary | ICD-10-CM

## 2022-12-30 DIAGNOSIS — L304 Erythema intertrigo: Secondary | ICD-10-CM | POA: Diagnosis not present

## 2022-12-30 DIAGNOSIS — Z1231 Encounter for screening mammogram for malignant neoplasm of breast: Secondary | ICD-10-CM

## 2022-12-30 DIAGNOSIS — R879 Unspecified abnormal finding in specimens from female genital organs: Secondary | ICD-10-CM | POA: Insufficient documentation

## 2022-12-30 DIAGNOSIS — N644 Mastodynia: Secondary | ICD-10-CM | POA: Diagnosis not present

## 2022-12-30 MED ORDER — CLOTRIMAZOLE 1 % EX CREA
1.0000 | TOPICAL_CREAM | Freq: Two times a day (BID) | CUTANEOUS | 1 refills | Status: AC
Start: 2022-12-30 — End: ?

## 2022-12-30 MED ORDER — FLUCONAZOLE 150 MG PO TABS
150.0000 mg | ORAL_TABLET | Freq: Once | ORAL | 1 refills | Status: AC
Start: 2022-12-30 — End: 2022-12-30

## 2022-12-30 NOTE — Patient Instructions (Addendum)
Please call  and schedule your 3D mammogram and /or bone density scan as we discussed.  We discussed the diagnostic mammogram and ultrasound as it pertains to your left breast due to pain.  Please reconsider this.  For now, based on your preference, I have ordered a screening mammogram.    [facility]  ( new location in 2023)  [address], [place], [place] [address]  [place], [place]  [phone]   I suspect you have yeast infection under skin fold of your breasts. This is called intertrigo candidiasis   Please use diflucan oral once and also use topical clotrimazole ointment under the breasts which is an antifungal and follow the instructions below.   Daily cleansing of intertriginous skin with a mild cleanser followed by drying of affected area with a hair dryer on a cool setting  ?Aeration of affected area when feasible and drying after shower. You may even use blow dryer on COOL setting after showers, baths to dry area  ?Daily application of drying powders, such as powders composed of microporous cellulose  ?Use of absorbent material or clothing, such as cotton or merino wool, to separate skin in folds  ?Application of barrier creams such as zinc oxide in areas that may come in contact with urine or feces  Intertrigo Intertrigo is skin irritation (inflammation) that happens in warm, moist areas of the body. The irritation can cause a rash and make skin raw and itchy. The rash is usually pink or red. It happens mostly between folds of skin or where skin rubs together, such as: Between the toes. In the armpits. In the groin area. Under the belly. Under the breasts. Around the butt area. This condition is not passed from person to person (is not contagious). What are the causes? Heat, moisture, rubbing, and not enough air movement. The condition can be made worse by: Sweat. Bacteria. A fungus, such as yeast. What increases the risk? Moisture in your  skin folds. You are more likely to develop this condition if you: Have diabetes. Are overweight. Are not able to move around. Live in a warm and moist climate. Wear splints, braces, or other medical devices. Are not able to control your pee (urine) or poop (stool). What are the signs or symptoms? A pink or red skin rash in the skin fold or near the skin fold. Raw or scaly skin. Itching. A burning feeling. Bleeding. Leaking fluid. A bad smell. How is this treated? Cleaning and drying your skin. Taking an antibiotic medicine or using an antibiotic skin cream for a bacterial infection. Using an antifungal cream on your skin or taking pills for an infection that was caused by a fungus, such as yeast. Using a steroid ointment to stop the itching and irritation. Separating the skin fold with a clean cotton cloth to absorb moisture and allow air to flow into the area. Follow these instructions at home: Keep the affected area clean and dry. Do not scratch your skin. Stay cool as much as you can. Use an air conditioner or a fan, if you have one. Apply over-the-counter and prescription medicines only as told by your doctor. If you were prescribed an antibiotic medicine, use it as told by your doctor. Do not stop using the antibiotic even if your condition starts to get better. Keep all follow-up visits as told by your doctor. This is important. How is this prevented?  Stay at a healthy weight. Take care of your feet. This is very  important if you have diabetes. You should: Wear shoes that fit well. Keep your feet dry. Wear clean cotton or wool socks. Protect the skin in your groin and butt area as told by your doctor. To do this: Follow a regular cleaning routine. Use creams, powders, or ointments that protect your skin. Change protection pads often. Do not wear tight clothes. Wear clothes that: Are loose. Take moisture away from your body. Are made of cotton. Wear a bra that gives  good support, if needed. Shower and dry yourself well after being active. Use a hair dryer on a cool setting to dry between skin folds. Keep your blood sugar under control if you have diabetes. Contact a doctor if: Your symptoms do not get better with treatment. Your symptoms get worse or they spread. You notice more redness and warmth. You have a fever. Summary Intertrigo is skin irritation that occurs when folds of skin rub together. This condition is caused by heat, moisture, and rubbing. This condition may be treated by cleaning and drying your skin and with medicines. Apply over-the-counter and prescription medicines only as told by your doctor. Keep all follow-up visits as told by your doctor. This is important. This information is not intended to replace advice given to you by your health care provider. Make sure you discuss any questions you have with your health care provider. Document Revised: 05/21/2018 Document Reviewed: 05/21/2018 Elsevier Patient Education  2022 [employer].

## 2022-12-30 NOTE — Assessment & Plan Note (Signed)
Elicited on exam. Reiterated the importance of diagnostic mammogram and ultrasound. She declines all diagnostic images. She would like to obtain screening mammogram and monitor symptom.  Close follow-up

## 2022-12-30 NOTE — Progress Notes (Signed)
Assessment & Plan:  Intertrigo Assessment & Plan: Presentation under  breasts most c/w intertrigo. Start clotrimazole. For vaginal itching, patient preferred to self swab. Provided diflucan to use based on symptoms.   Orders: -     Clotrimazole; Apply 1 Application topically 2 (two) times daily.  Dispense: 30 g; Refill: 1 -     Fluconazole; Take 1 tablet (150 mg total) by mouth once for 1 dose. Take one tablet PO once. If sxs persist, may take one tablet PO 3 days later.  Dispense: 2 tablet; Refill: 1  Essential hypertension Assessment & Plan: Slightly elevated today.   Continue amlodipine 10 mg. Close follow up , consider adding on HCTZ.   Orders: -     CBC with Differential/Platelet; Future -     Comprehensive metabolic panel; Future -     Lipid panel; Future -     TSH; Future  Encounter for screening mammogram for malignant neoplasm of breast -     3D Screening Mammogram, Left and Right; Future  History of vitamin D deficiency -     VITAMIN D 25 Hydroxy (Vit-D Deficiency, Fractures); Future  Breast pain, left Assessment & Plan: Elicited on exam. Reiterated the importance of diagnostic mammogram and ultrasound. She declines all diagnostic images. She would like to obtain screening mammogram and monitor symptom.  Close follow-up   Abnormal vaginal fluids -     Cervicovaginal ancillary only     Return precautions given.   Risks, benefits, and alternatives of the medications and treatment plan prescribed today were discussed, and patient expressed understanding.   Education regarding symptom management and diagnosis given to patient on AVS either electronically or printed.  Return in about 6 weeks (around 02/10/2023) for Fasting labs in 2-3 weeks.  Rennie Plowman, FNP  Subjective:    Patient ID: Emma Riggs, female    DOB: 07-14-1966, 57 y.o.   MRN: [id number]  CC: Emma Riggs is a 57 y.o. female who presents today for follow up.   HPI: Complains of itching  under bilateral breasts.  Color around bilateral areola is darker.   In the left breast close axilla she is tenderness.  No mass.  She also has had vaginal itching for one month  She has been using monistat with some relief and then symptoms come back  No vaginal bleeding, pelvic pain, vaginal discharge.     Allergies: Patient has no known allergies. Current Outpatient Medications on File Prior to Visit  Medication Sig Dispense Refill   amLODipine (NORVASC) 10 MG tablet Take 1 tablet (10 mg total) by mouth daily. 90 tablet 3   rosuvastatin (CRESTOR) 10 MG tablet TAKE 1 TABLET(10 MG) BY MOUTH DAILY 90 tablet 3   Cholecalciferol (VITAMIN D3) 1.25 MG (50000 UT) CAPS TAKE 1 CAPSULE BY MOUTH ONCE WEEKLY FOR 8 WEEKS (Patient not taking: Reported on 12/30/2022) 4 capsule 1   Multiple Vitamin (MULTIVITAMIN) tablet Take 1 tablet by mouth daily. (Patient not taking: Reported on 12/30/2022)     No current facility-administered medications on file prior to visit.    Review of Systems  Constitutional:  Negative for chills and fever.  Respiratory:  Negative for cough.   Cardiovascular:  Negative for chest pain and palpitations.  Gastrointestinal:  Negative for nausea and vomiting.  Genitourinary:  Negative for dysuria, menstrual problem, pelvic pain, vaginal bleeding and vaginal discharge.  Skin:  Positive for rash.      Objective:    BP 138/78   Pulse  82   Temp 97.9 F (36.6 C) (Oral)   Ht 4\' 11"  (1.499 m)   Wt 225 lb (102.1 kg)   SpO2 97%   BMI 45.44 kg/m  BP Readings from Last 3 Encounters:  12/30/22 138/78  02/13/22 138/71  01/16/22 120/80   Wt Readings from Last 3 Encounters:  12/30/22 225 lb (102.1 kg)  02/13/22 220 lb (99.8 kg)  01/16/22 219 lb 3.2 oz (99.4 kg)    Physical Exam Vitals reviewed.  Constitutional:      Appearance: She is well-developed.  Eyes:     Conjunctiva/sclera: Conjunctivae normal.  Cardiovascular:     Rate and Rhythm: Normal rate and regular  rhythm.     Pulses: Normal pulses.     Heart sounds: Normal heart sounds.  Pulmonary:     Effort: Pulmonary effort is normal.     Breath sounds: Normal breath sounds. No wheezing, rhonchi or rales.  Chest:  Breasts:    Right: No swelling, bleeding, inverted nipple, mass, nipple discharge, skin change or tenderness.     Left: Tenderness present. No swelling, bleeding, inverted nipple, mass, nipple discharge or skin change.       Comments: Hyperpigmentation around areola bilaterally which is symmetric Under breasts, moisture and clear film present.  No rash.  Skin:    General: Skin is warm and dry.  Neurological:     Mental Status: She is alert.  Psychiatric:        Speech: Speech normal.        Behavior: Behavior normal.        Thought Content: Thought content normal.

## 2022-12-30 NOTE — Assessment & Plan Note (Addendum)
Slightly elevated today.   Continue amlodipine 10 mg. Close follow up , consider adding on HCTZ.

## 2022-12-30 NOTE — Assessment & Plan Note (Addendum)
Presentation under  breasts most c/w intertrigo. Start clotrimazole. For vaginal itching, patient preferred to self swab. Provided diflucan to use based on symptoms.

## 2023-01-01 LAB — CERVICOVAGINAL ANCILLARY ONLY
Bacterial Vaginitis (gardnerella): NEGATIVE
Candida Glabrata: NEGATIVE
Candida Vaginitis: NEGATIVE
Comment: NEGATIVE
Comment: NEGATIVE
Comment: NEGATIVE

## 2023-01-07 ENCOUNTER — Other Ambulatory Visit (INDEPENDENT_AMBULATORY_CARE_PROVIDER_SITE_OTHER): Payer: 59

## 2023-01-07 DIAGNOSIS — Z8639 Personal history of other endocrine, nutritional and metabolic disease: Secondary | ICD-10-CM

## 2023-01-07 DIAGNOSIS — I10 Essential (primary) hypertension: Secondary | ICD-10-CM

## 2023-01-07 DIAGNOSIS — R748 Abnormal levels of other serum enzymes: Secondary | ICD-10-CM

## 2023-01-08 LAB — COMPREHENSIVE METABOLIC PANEL
ALT: 20 IU/L (ref 0–32)
AST: 28 IU/L (ref 0–40)
Albumin/Globulin Ratio: 1.3 (ref 1.2–2.2)
Albumin: 4.8 g/dL (ref 3.8–4.9)
Alkaline Phosphatase: 153 IU/L — ABNORMAL HIGH (ref 44–121)
BUN/Creatinine Ratio: 16 (ref 9–23)
BUN: 14 mg/dL (ref 6–24)
Bilirubin Total: 0.7 mg/dL (ref 0.0–1.2)
CO2: 21 mmol/L (ref 20–29)
Calcium: 9.9 mg/dL (ref 8.7–10.2)
Chloride: 103 mmol/L (ref 96–106)
Creatinine, Ser: 0.9 mg/dL (ref 0.57–1.00)
Globulin, Total: 3.7 g/dL (ref 1.5–4.5)
Glucose: 104 mg/dL — ABNORMAL HIGH (ref 70–99)
Potassium: 5 mmol/L (ref 3.5–5.2)
Sodium: 140 mmol/L (ref 134–144)
Total Protein: 8.5 g/dL (ref 6.0–8.5)
eGFR: 75 mL/min/{1.73_m2} (ref >59)

## 2023-01-08 LAB — CBC WITH DIFFERENTIAL/PLATELET
Basophils Absolute: 0 10*3/uL (ref 0.0–0.2)
Basos: 1 %
EOS (ABSOLUTE): 0.2 10*3/uL (ref 0.0–0.4)
Eos: 3 %
Hematocrit: 42.8 % (ref 34.0–46.6)
Hemoglobin: 14 g/dL (ref 11.1–15.9)
Immature Grans (Abs): 0 10*3/uL (ref 0.0–0.1)
Immature Granulocytes: 0 %
Lymphocytes Absolute: 2 10*3/uL (ref 0.7–3.1)
Lymphs: 37 %
MCH: 30.8 pg (ref 26.6–33.0)
MCHC: 32.7 g/dL (ref 31.5–35.7)
MCV: 94 fL (ref 79–97)
Monocytes Absolute: 0.5 10*3/uL (ref 0.1–0.9)
Monocytes: 9 %
Neutrophils Absolute: 2.7 10*3/uL (ref 1.4–7.0)
Neutrophils: 50 %
Platelets: 441 10*3/uL (ref 150–450)
RBC: 4.54 x10E6/uL (ref 3.77–5.28)
RDW: 12.8 % (ref 11.7–15.4)
WBC: 5.3 10*3/uL (ref 3.4–10.8)

## 2023-01-08 LAB — LIPID PANEL
Chol/HDL Ratio: 2 ratio (ref 0.0–4.4)
Cholesterol, Total: 161 mg/dL (ref 100–199)
HDL: 81 mg/dL (ref >39)
LDL Chol Calc (NIH): 61 mg/dL (ref 0–99)
Triglycerides: 105 mg/dL (ref 0–149)
VLDL Cholesterol Cal: 19 mg/dL (ref 5–40)

## 2023-01-08 LAB — VITAMIN D 25 HYDROXY (VIT D DEFICIENCY, FRACTURES): Vit D, 25-Hydroxy: 23.2 ng/mL — ABNORMAL LOW (ref 30.0–100.0)

## 2023-01-08 LAB — TSH: TSH: 1.24 u[IU]/mL (ref 0.450–4.500)

## 2023-01-09 ENCOUNTER — Telehealth: Payer: Self-pay | Admitting: *Deleted

## 2023-01-09 NOTE — Telephone Encounter (Signed)
Left voicemail to see if pt is agreeable to GI referral discussed in "read" mychart message.  If agreeable please route back to clinical pool (See referral instructions in note)

## 2023-01-13 NOTE — Addendum Note (Signed)
Addended by: [place], Aurelia Gras on: 01/13/2023 04:24 PM   Modules accepted: Orders

## 2023-01-15 ENCOUNTER — Other Ambulatory Visit: Payer: Self-pay | Admitting: Family

## 2023-01-15 DIAGNOSIS — I10 Essential (primary) hypertension: Secondary | ICD-10-CM

## 2023-01-30 ENCOUNTER — Ambulatory Visit: Payer: 59 | Admitting: Physician Assistant

## 2023-01-30 ENCOUNTER — Encounter: Payer: Self-pay | Admitting: Physician Assistant

## 2023-01-30 VITALS — BP 125/78 | HR 90 | Temp 98.0°F | Ht 59.0 in | Wt 227.4 lb

## 2023-01-30 DIAGNOSIS — K760 Fatty (change of) liver, not elsewhere classified: Secondary | ICD-10-CM | POA: Diagnosis not present

## 2023-01-30 DIAGNOSIS — R748 Abnormal levels of other serum enzymes: Secondary | ICD-10-CM | POA: Diagnosis not present

## 2023-01-30 NOTE — Progress Notes (Signed)
Emma Amy, PA-C [address]  [address]  [place], [place] [address]  Main: [phone]  Fax: [phone]   Gastroenterology Consultation  Referring Provider:     Allegra Grana, FNP Primary Care Physician:  Allegra Grana, FNP Primary Gastroenterologist:  Emma Amy, PA-C / Dr. Lannette Donath  Reason for Consultation:     Elevated alkaline phosphatase        HPI:   Emma Riggs is a 57 y.o. y/o female referred for consultation & management  by Allegra Grana, FNP.    She has had isolated elevated alkaline phosphatase for 1 year, gradually increasing.  All other LFTs normal.    Lab 01/07/2023 showed alkaline phosphatase 153.  Normal total bilirubin 0.7, AST 28, ALT 20.  Normal CBC.  1 year ago Alk Phos 125.  2 years ago Alk Phos 119.  RUQ ultrasound 04/2022 showed Hepatic steatosis, otherwise normal.  No gallstones.  Common bile duct 3.6 mm.  Patient denies any GI symptoms such as abdominal pain, nausea, vomiting, jaundice, diarrhea, or constipation.  She rarely drinks social alcohol.  No family history of liver disease.  No recent new medications.  She did start vitamin D 3 weeks ago.  First screening colonoscopy done by Dr. Allegra Lai 01/2022 was normal.  Repeat in 10 years.    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 She has both ovaries.   COLONOSCOPY WITH PROPOFOL N/A 02/13/2022   Procedure: COLONOSCOPY WITH PROPOFOL;  Surgeon: Toney Reil, MD;  Location: [facility] ENDOSCOPY;  Service: Gastroenterology;  Laterality: N/A;    Prior to Admission medications   Medication Sig Start Date End Date Taking? Authorizing Provider  amLODipine (NORVASC) 10 MG tablet TAKE 1 TABLET(10 MG) BY MOUTH DAILY 01/15/23   Allegra Grana, FNP  Cholecalciferol (VITAMIN D3) 1.25 MG (50000  UT) CAPS TAKE 1 CAPSULE BY MOUTH ONCE WEEKLY FOR 8 WEEKS Patient not taking: Reported on 12/30/2022 03/22/22   Allegra Grana, FNP  clotrimazole (LOTRIMIN) 1 % cream Apply 1 Application topically 2 (two) times daily. 12/30/22   Allegra Grana, FNP  Multiple Vitamin (MULTIVITAMIN) tablet Take 1 tablet by mouth daily. Patient not taking: Reported on 12/30/2022    [provider]  rosuvastatin (CRESTOR) 10 MG tablet TAKE 1 TABLET(10 MG) BY MOUTH DAILY 07/01/22   Allegra Grana, FNP    Family History  Problem Relation Age of Onset   Stroke Mother    Cancer Father        Lung Cancer   Colon cancer Neg Hx      Social History   Tobacco Use   Smoking status: Never   Smokeless tobacco: Never  Vaping Use   Vaping Use: Never used  Substance Use Topics   Alcohol use: Yes    Alcohol/week: 0.0 standard drinks of alcohol    Comment: Rare   Drug use: No    Allergies as of 01/30/2023   (No Known Allergies)    Review of Systems:    All systems reviewed and negative except where noted in HPI.   Physical Exam:  BP 125/78   Pulse 90   Temp 98 F (36.7 C)   Ht 4\' 11"  (1.499 m)   Wt 227 lb 6.4 oz (103.1  kg)   BMI 45.93 kg/m  No LMP recorded. Patient has had a hysterectomy. Psych:  Alert and cooperative. Normal mood and affect. General:   Alert,  Well-developed, well-nourished, pleasant and cooperative in NAD Head:  Normocephalic and atraumatic. Eyes:  Sclera clear, no icterus.   Conjunctiva pink. Neck:  Supple; no masses or thyromegaly. Lungs:  Respirations even and unlabored.  Clear throughout to auscultation.   No wheezes, crackles, or rhonchi. No acute distress. Heart:  Regular rate and rhythm; no murmurs, clicks, rubs, or gallops. Abdomen:  Normal bowel sounds.  No bruits.  Soft, and non-distended without masses, hepatosplenomegaly or hernias noted.  No Tenderness.  No guarding or rebound tenderness.    Neurologic:  Alert and oriented x3;  grossly normal  neurologically. Psych:  Alert and cooperative. Normal mood and affect.  Imaging Studies: No results found.  Assessment and Plan:   Emma Riggs is a 57 y.o. y/o female has been referred for elevated alkaline phosphatase.   History of Fatty Liver Disease.     Plan:  Labs: PTH, intact and calcium, GGT, alkaline phosphatase isoenzymes, TSH, ANA, AMA, ASMA. 2.   Recommend a low-fat diet, regular exercise, and weight loss. Patient education handout about fatty liver disease was given and discussed from up-to-date.  3.   Recommend she avoid all alcohol and any medication which may cause drug-induced liver injury.   Avoid OTC supplements.    Follow up in 6 months with Dr. Allegra Lai.  Emma Amy, PA-C

## 2023-01-31 LAB — ALKALINE PHOSPHATASE, ISOENZYMES

## 2023-01-31 LAB — PTH, INTACT AND CALCIUM

## 2023-01-31 LAB — MITOCHONDRIAL/SMOOTH MUSCLE AB PNL

## 2023-01-31 LAB — ANA: Anti Nuclear Antibody (ANA): NEGATIVE

## 2023-02-01 LAB — ALKALINE PHOSPHATASE, ISOENZYMES

## 2023-02-01 LAB — PTH, INTACT AND CALCIUM: PTH: 57 pg/mL (ref 15–65)

## 2023-02-04 LAB — ALKALINE PHOSPHATASE, ISOENZYMES

## 2023-02-04 LAB — TSH: TSH: 0.876 u[IU]/mL (ref 0.450–4.500)

## 2023-02-05 ENCOUNTER — Telehealth: Payer: Self-pay

## 2023-02-05 NOTE — Progress Notes (Signed)
Notify patient alkaline phosphatase is mildly elevated, yet improved from 4 weeks ago.  Calcium and PTH are normal.  No evidence of hyperparathyroidism.  GGT liver test is normal.  TSH thyroid test is normal.  ANA, AMA, and ASMA are normal/negative.  No evidence of autoimmune hepatitis or primary biliary cirrhosis.  All labs look good.  Nothing worrisome.  Continue with current plan.  Follow-up in 6 months.

## 2023-02-05 NOTE — Telephone Encounter (Signed)
Left message for patient to return call to office.  Notify patient alkaline phosphatase is mildly elevated, yet improved from 4 weeks ago.  Calcium and PTH are normal.  No evidence of hyperparathyroidism.  GGT liver test is normal.  TSH thyroid test is normal.  ANA, AMA, and ASMA are normal/negative.  No evidence of autoimmune hepatitis or primary biliary cirrhosis.  All labs look good.  Nothing worrisome.  Continue with current plan.  Follow-up in 6 months.

## 2023-02-06 ENCOUNTER — Telehealth: Payer: Self-pay

## 2023-02-06 NOTE — Telephone Encounter (Signed)
Spoke with patient and she was placed in Recall box for 6 month follow up to check labs. Notiifed of all lab results.

## 2023-02-10 ENCOUNTER — Ambulatory Visit: Payer: 59 | Admitting: Family

## 2023-02-10 VITALS — BP 128/76 | HR 73 | Temp 97.9°F | Ht 59.0 in | Wt 228.4 lb

## 2023-02-10 DIAGNOSIS — I10 Essential (primary) hypertension: Secondary | ICD-10-CM

## 2023-02-10 DIAGNOSIS — R748 Abnormal levels of other serum enzymes: Secondary | ICD-10-CM | POA: Diagnosis not present

## 2023-02-10 LAB — ALKALINE PHOSPHATASE, ISOENZYMES: Alkaline Phosphatase: 140 IU/L — ABNORMAL HIGH (ref 44–121)

## 2023-02-10 LAB — GAMMA GT: GGT: 17 IU/L (ref 0–60)

## 2023-02-10 LAB — PTH, INTACT AND CALCIUM: Calcium: 9.7 mg/dL (ref 8.7–10.2)

## 2023-02-10 NOTE — Patient Instructions (Signed)
Please call  and schedule your 3D mammogram and /or bone density scan as we discussed. ? ? [facility]  ( new location in 2023) ? ?[address], [place], [place] [address] ? ?[place], [place]  ?[phone] ? ? ?

## 2023-02-10 NOTE — Progress Notes (Unsigned)
   Assessment & Plan:  There are no diagnoses linked to this encounter.   Return precautions given.   Risks, benefits, and alternatives of the medications and treatment plan prescribed today were discussed, and patient expressed understanding.   Education regarding symptom management and diagnosis given to patient on AVS either electronically or printed.  Return in about 6 months (around 08/12/2023) for Complete Physical Exam.  Rennie Plowman, FNP  Subjective:    Patient ID: Emma Riggs, female    DOB: Aug 06, 1966, 57 y.o.   MRN: [id number]  CC: Emma Riggs is a 57 y.o. female who presents today for follow up.   HPI: Left breast pain has completely resolved. She feels well today.  No new complaints.  She is compliant with amlodipine 10 mg daily   Consult gastroenterology 01/30/2023 in regards to elevation alkaline phosphatase.  No evidence of autoimmune hepatitis or primary biliary cirrhosis.  Follow-up in 6 months time Allergies: Patient has no known allergies. Current Outpatient Medications on File Prior to Visit  Medication Sig Dispense Refill   amLODipine (NORVASC) 10 MG tablet TAKE 1 TABLET(10 MG) BY MOUTH DAILY 90 tablet 3   Cholecalciferol (VITAMIN D3) 20 MCG (800 UNIT) TABS Take 1 tablet by mouth daily.     clotrimazole (LOTRIMIN) 1 % cream Apply 1 Application topically 2 (two) times daily. 30 g 1   Multiple Vitamin (MULTIVITAMIN) tablet Take 1 tablet by mouth daily.     rosuvastatin (CRESTOR) 10 MG tablet TAKE 1 TABLET(10 MG) BY MOUTH DAILY 90 tablet 3   No current facility-administered medications on file prior to visit.    Review of Systems  Constitutional:  Negative for chills and fever.  Respiratory:  Negative for cough.   Cardiovascular:  Negative for chest pain and palpitations.  Gastrointestinal:  Negative for nausea and vomiting.      Objective:    BP 128/76   Pulse 73   Temp 97.9 F (36.6 C) (Oral)   Ht 4\' 11"  (1.499 m)   Wt 228 lb 6.4 oz (103.6  kg)   SpO2 99%   BMI 46.13 kg/m  BP Readings from Last 3 Encounters:  02/10/23 128/76  01/30/23 125/78  12/30/22 138/78   Wt Readings from Last 3 Encounters:  02/10/23 228 lb 6.4 oz (103.6 kg)  01/30/23 227 lb 6.4 oz (103.1 kg)  12/30/22 225 lb (102.1 kg)    Physical Exam Vitals reviewed.  Constitutional:      Appearance: She is well-developed.  Eyes:     Conjunctiva/sclera: Conjunctivae normal.  Cardiovascular:     Rate and Rhythm: Normal rate and regular rhythm.     Pulses: Normal pulses.     Heart sounds: Normal heart sounds.  Pulmonary:     Effort: Pulmonary effort is normal.     Breath sounds: Normal breath sounds. No wheezing, rhonchi or rales.  Skin:    General: Skin is warm and dry.  Neurological:     Mental Status: She is alert.  Psychiatric:        Speech: Speech normal.        Behavior: Behavior normal.        Thought Content: Thought content normal.

## 2023-02-10 NOTE — Assessment & Plan Note (Signed)
Chronic, improved. Continue amlodipine 10mg  qd

## 2023-07-10 ENCOUNTER — Other Ambulatory Visit: Payer: Self-pay | Admitting: Family

## 2023-07-10 DIAGNOSIS — E782 Mixed hyperlipidemia: Secondary | ICD-10-CM

## 2023-08-06 ENCOUNTER — Ambulatory Visit: Payer: 59 | Admitting: Physician Assistant

## 2023-08-06 ENCOUNTER — Encounter: Payer: Self-pay | Admitting: Physician Assistant

## 2023-08-06 VITALS — BP 119/78 | HR 80 | Temp 97.8°F | Ht 59.0 in | Wt 226.0 lb

## 2023-08-06 DIAGNOSIS — Z6841 Body Mass Index (BMI) 40.0 and over, adult: Secondary | ICD-10-CM

## 2023-08-06 DIAGNOSIS — R748 Abnormal levels of other serum enzymes: Secondary | ICD-10-CM | POA: Diagnosis not present

## 2023-08-06 DIAGNOSIS — R7989 Other specified abnormal findings of blood chemistry: Secondary | ICD-10-CM

## 2023-08-06 DIAGNOSIS — E669 Obesity, unspecified: Secondary | ICD-10-CM

## 2023-08-06 DIAGNOSIS — K760 Fatty (change of) liver, not elsewhere classified: Secondary | ICD-10-CM

## 2023-08-06 NOTE — Progress Notes (Signed)
Celso Amy, PA-C [address]  [address]  [place], [place] [address]  Main: [phone]  Fax: [phone]   Primary Care Physician: Allegra Grana, FNP  Primary Gastroenterologist:  Celso Amy, PA-C / Dr. Lannette Donath    CC: Follow-up elevated alk phos  HPI: Emma Riggs is a 57 y.o. female for 86-month follow-up of elevated alkaline phosphatase.  All other LFTs normal.  Lab 01/07/2023 showed alkaline phosphatase 153.  Normal total bilirubin 0.7, AST 28, ALT 20.  Normal CBC.  1 year ago Alk Phos 125.  2 years ago Alk Phos 119.  01/30/2023 labs: Alk phos 140, normal GGT 17.  Normal calcium, PTH, TSH, ANA, AMA, ASMA.   RUQ ultrasound 04/2022 showed Hepatic steatosis, otherwise normal.  No gallstones.  Common bile duct 3.6 mm.   Patient denies any GI symptoms such as abdominal pain, nausea, vomiting, jaundice, diarrhea, or constipation.  She rarely drinks social alcohol.  No family history of liver disease.  No recent new medications.  She was on a vitamin D supplement a few months ago, however she has not been taking.   First screening colonoscopy done by Dr. Allegra Lai 01/2022 was normal.  Repeat in 10 years.  Current Outpatient Medications  Medication Sig Dispense Refill   amLODipine (NORVASC) 10 MG tablet TAKE 1 TABLET(10 MG) BY MOUTH DAILY 90 tablet 3   Cholecalciferol (VITAMIN D3) 20 MCG (800 UNIT) TABS Take 1 tablet by mouth daily.     clotrimazole (LOTRIMIN) 1 % cream Apply 1 Application topically 2 (two) times daily. 30 g 1   Multiple Vitamin (MULTIVITAMIN) tablet Take 1 tablet by mouth daily.     rosuvastatin (CRESTOR) 10 MG tablet TAKE 1 TABLET(10 MG) BY MOUTH DAILY 90 tablet 3   No current facility-administered medications for this visit.    Allergies as of 08/06/2023   (No Known Allergies)    Past Medical History:  Diagnosis Date   Hyperlipemia    Hypertension    Ringing in ear     Past Surgical History:  Procedure Laterality Date   ABDOMINAL  HYSTERECTOMY     1996, Partial due noncancerous reasons, fibroids. She has both ovaries.   COLONOSCOPY WITH PROPOFOL N/A 02/13/2022   Procedure: COLONOSCOPY WITH PROPOFOL;  Surgeon: Toney Reil, MD;  Location: [facility];  Service: Gastroenterology;  Laterality: N/A;    Review of Systems:    All systems reviewed and negative except where noted in HPI.   Physical Examination:   BP 119/78   Pulse 80   Temp 97.8 F (36.6 C)   Ht 4\' 11"  (1.499 m)   Wt 226 lb (102.5 kg)   BMI 45.65 kg/m   General: Well-nourished, well-developed in no acute distress.  Lungs: Clear to auscultation bilaterally. Non-labored. Heart: Regular rate and rhythm, no murmurs rubs or gallops.  Abdomen: Bowel sounds are normal; Abdomen is Soft; No hepatosplenomegaly, masses or hernias;  No Abdominal Tenderness; No guarding or rebound tenderness. Neuro: Alert and oriented x 3.  Grossly intact.  Psych: Alert and cooperative, normal mood and affect.   Imaging Studies: No results found.  Assessment and Plan:   CHELENE SFERRAZZA is a 57 y.o. y/o female returns for 45-month follow-up of mild isolated elevated alkaline phosphatase.  All other LFTs normal.  Labs 6 months ago showed no liver pathology.  Has history of low vitamin D and started supplement several months ago.  Not currently taking.  1.  Isolated elevated alkaline phosphatase;  GGT was normal indicating not likely liver origin.  Calcium and PTH normal.  Lab: Hepatic panel  Follow-up with PCP to monitor labs in the future  Return if liver tests become more elevated  2.  Hepatic steatosis  Lab: NASH FibroSure  Recommend a low-fat diet, regular exercise, and weight loss. Patient education handout about fatty liver disease was given and discussed from up-to-date.   3.  Low vitamin D  Lab: vitamin D   Not currently taking vitamin D supplement.  If vitamin D is low, then restart supplement.  4.  Obesity  Encouraged weight loss.  Celso Amy,  PA-C  Follow up as needed

## 2023-08-08 ENCOUNTER — Other Ambulatory Visit: Payer: Self-pay | Admitting: Physician Assistant

## 2023-08-08 DIAGNOSIS — R7989 Other specified abnormal findings of blood chemistry: Secondary | ICD-10-CM

## 2023-08-08 LAB — HEPATIC FUNCTION PANEL
ALT: 24 [IU]/L (ref 0–32)
AST: 30 [IU]/L (ref 0–40)
Albumin: 4.7 g/dL (ref 3.8–4.9)
Alkaline Phosphatase: 150 [IU]/L — ABNORMAL HIGH (ref 44–121)
Bilirubin Total: 0.6 mg/dL (ref 0.0–1.2)
Bilirubin, Direct: <0.08 mg/dL (ref 0.00–0.40)
Total Protein: 8.7 g/dL — ABNORMAL HIGH (ref 6.0–8.5)

## 2023-08-08 LAB — NASH FIBROSURE(R) PLUS
ALPHA 2-MACROGLOBULINS, QN: 180 mg/dL (ref 110–276)
ALT (SGPT) P5P: 22 [IU]/L (ref 0–40)
AST (SGOT) P5P: 28 [IU]/L (ref 0–40)
Apolipoprotein A-1: 214 mg/dL — ABNORMAL HIGH (ref 116–209)
Bilirubin, Total: 0.4 mg/dL (ref 0.0–1.2)
Cholesterol, Total: 184 mg/dL (ref 100–199)
Fibrosis Score: 0.05 (ref 0.00–0.21)
GGT: 21 [IU]/L (ref 0–60)
Glucose: 94 mg/dL (ref 70–99)
Haptoglobin: 245 mg/dL (ref 33–346)
NASH Score: 0 (ref 0.00–0.25)
Steatosis Score: 0.31 (ref 0.00–0.40)
Triglycerides: 144 mg/dL (ref 0–149)

## 2023-08-08 LAB — VITAMIN D 25 HYDROXY (VIT D DEFICIENCY, FRACTURES): Vit D, 25-Hydroxy: 21.8 ng/mL — ABNORMAL LOW (ref 30.0–100.0)

## 2023-08-08 MED ORDER — VITAMIN D (ERGOCALCIFEROL) 1.25 MG (50000 UNIT) PO CAPS: 50000.0000 [IU] | ORAL_CAPSULE | ORAL | 3 refills | Status: AC

## 2023-08-08 NOTE — Progress Notes (Signed)
Hi Ms. Overweg, Your labs show: 1.  No evidence of fatty liver or liver fibrosis (scar tissue).  This is great news! 2.  Alkaline phosphatase is still mildly elevated, yet stable.  All other liver tests are normal. 3.  Your vitamin D level was very low.  I suspect alkaline phosphatase is elevated because your vitamin D is low.  I recommend start vitamin D 50,000 units once per week for 3 months.  I will call in a prescription to your pharmacy.  Please call our office to schedule repeat lab visit in 3 months to repeat vitamin D and hepatic panel. Celso Amy, PA-C

## 2023-08-08 NOTE — Progress Notes (Unsigned)
   Assessment & Plan:  There are no diagnoses linked to this encounter.   Return precautions given.   Risks, benefits, and alternatives of the medications and treatment plan prescribed today were discussed, and patient expressed understanding.   Education regarding symptom management and diagnosis given to patient on AVS either electronically or printed.  No follow-ups on file.  Rennie Plowman, FNP  Subjective:    Patient ID: Emma Riggs, female    DOB: [birth year], 57 y.o.   MRN: [id number]  CC: Emma Riggs is a 57 y.o. female who presents today for physical exam.    HPI: Elevation alkaline phosphatase, likely more bone etiology   Consult with gastroenterology 08/06/2023    Colorectal Cancer Screening: UTD ,colonoscopy done by Dr. Allegra Lai 01/2022 was normal. Repeat in 10 years.  Breast Cancer Screening: Mammogram UTD*** Cervical Cancer Screening: UTD*** Bone Health screening/DEXA for 65+: Obtain baseline bone density in the setting of elevation alk phos  Lung Cancer Screening: Doesn't have 20 year pack year history and age > 98 years yo 67 years***   AAA screen for all men and women aged 66 to 59 with h/o tobacco, men 33 years older with family h/o AAA and women 65 years or older who have ever smoked or have family history of AAA.        Tetanus -         Pneumococcal - Candidate for. ***  Hepatitis C screening - Candidate for*** HIV Screening- Candidate for *** Exercise: Gets regular exercise.   Alcohol use:  *** Smoking/tobacco use: Nonsmoker.    Health Maintenance  Topic Date Due   Zoster (Shingles) Vaccine (1 of 2) Never done   Mammogram  08/02/2022   Flu Shot  Never done   COVID-19 Vaccine (3 - 2024-25 season) 04/27/2023   DTaP/Tdap/Td vaccine (3 - Td or Tdap) 01/17/2032   Colon Cancer Screening  02/14/2032   Hepatitis C Screening  Completed   HPV Vaccine  Aged Out   HIV Screening  Discontinued    ALLERGIES: Patient has no known allergies.  Current  Outpatient Medications on File Prior to Visit  Medication Sig Dispense Refill   amLODipine (NORVASC) 10 MG tablet TAKE 1 TABLET(10 MG) BY MOUTH DAILY 90 tablet 3   Cholecalciferol (VITAMIN D3) 20 MCG (800 UNIT) TABS Take 1 tablet by mouth daily.     clotrimazole (LOTRIMIN) 1 % cream Apply 1 Application topically 2 (two) times daily. 30 g 1   Multiple Vitamin (MULTIVITAMIN) tablet Take 1 tablet by mouth daily.     rosuvastatin (CRESTOR) 10 MG tablet TAKE 1 TABLET(10 MG) BY MOUTH DAILY 90 tablet 3   No current facility-administered medications on file prior to visit.    Review of Systems    Objective:    There were no vitals taken for this visit.  BP Readings from Last 3 Encounters:  08/06/23 119/78  02/10/23 128/76  01/30/23 125/78   Wt Readings from Last 3 Encounters:  08/06/23 226 lb (102.5 kg)  02/10/23 228 lb 6.4 oz (103.6 kg)  01/30/23 227 lb 6.4 oz (103.1 kg)    Physical Exam

## 2023-08-11 ENCOUNTER — Telehealth: Payer: Self-pay

## 2023-08-11 NOTE — Telephone Encounter (Signed)
Left message to call office.  [name], Schedule lab visit in 3 months to check hepatic panal and Vitmin D.  Dx: Elevated Alkaline Phosphatase and Low Vitamin D.  Celso Amy, PA-C

## 2023-08-12 ENCOUNTER — Ambulatory Visit (INDEPENDENT_AMBULATORY_CARE_PROVIDER_SITE_OTHER): Payer: 59 | Admitting: Family

## 2023-08-12 ENCOUNTER — Encounter: Payer: Self-pay | Admitting: Family

## 2023-08-12 ENCOUNTER — Telehealth: Payer: Self-pay

## 2023-08-12 VITALS — BP 122/82 | HR 90 | Temp 97.4°F | Ht 59.0 in | Wt 228.2 lb

## 2023-08-12 DIAGNOSIS — Z0000 Encounter for general adult medical examination without abnormal findings: Secondary | ICD-10-CM

## 2023-08-12 DIAGNOSIS — Z780 Asymptomatic menopausal state: Secondary | ICD-10-CM

## 2023-08-12 DIAGNOSIS — R748 Abnormal levels of other serum enzymes: Secondary | ICD-10-CM | POA: Diagnosis not present

## 2023-08-12 DIAGNOSIS — Z1231 Encounter for screening mammogram for malignant neoplasm of breast: Secondary | ICD-10-CM

## 2023-08-12 NOTE — Patient Instructions (Addendum)
Please call  and schedule your 3D mammogram and /or bone density scan as we discussed.   [facility]  ( new location in 2023)  [address], [place], [place] [address]  [place], [place]  [phone]   Health Maintenance for Postmenopausal Women Menopause is a normal process in which your ability to get pregnant comes to an end. This process happens slowly over many months or years, usually between the ages of 38 and 29. Menopause is complete when you have missed your menstrual period for 12 months. It is important to talk with your health care provider about some of the most common conditions that affect women after menopause (postmenopausal women). These include heart disease, cancer, and bone loss (osteoporosis). Adopting a healthy lifestyle and getting preventive care can help to promote your health and wellness. The actions you take can also lower your chances of developing some of these common conditions. What are the signs and symptoms of menopause? During menopause, you may have the following symptoms: Hot flashes. These can be moderate or severe. Night sweats. Decrease in sex drive. Mood swings. Headaches. Tiredness (fatigue). Irritability. Memory problems. Problems falling asleep or staying asleep. Talk with your health care provider about treatment options for your symptoms. Do I need hormone replacement therapy? Hormone replacement therapy is effective in treating symptoms that are caused by menopause, such as hot flashes and night sweats. Hormone replacement carries certain risks, especially as you become older. If you are thinking about using estrogen or estrogen with progestin, discuss the benefits and risks with your health care provider. How can I reduce my risk for heart disease and stroke? The risk of heart disease, heart attack, and stroke increases as you age. One of the causes may be a change in the body's hormones during menopause. This can  affect how your body uses dietary fats, triglycerides, and cholesterol. Heart attack and stroke are medical emergencies. There are many things that you can do to help prevent heart disease and stroke. Watch your blood pressure High blood pressure causes heart disease and increases the risk of stroke. This is more likely to develop in people who have high blood pressure readings or are overweight. Have your blood pressure checked: Every 3-5 years if you are 3-52 years of age. Every year if you are 89 years old or older. Eat a healthy diet  Eat a diet that includes plenty of vegetables, fruits, low-fat dairy products, and lean protein. Do not eat a lot of foods that are high in solid fats, added sugars, or sodium. Get regular exercise Get regular exercise. This is one of the most important things you can do for your health. Most adults should: Try to exercise for at least 150 minutes each week. The exercise should increase your heart rate and make you sweat (moderate-intensity exercise). Try to do strengthening exercises at least twice each week. Do these in addition to the moderate-intensity exercise. Spend less time sitting. Even light physical activity can be beneficial. Other tips Work with your health care provider to achieve or maintain a healthy weight. Do not use any products that contain nicotine or tobacco. These products include cigarettes, chewing tobacco, and vaping devices, such as e-cigarettes. If you need help quitting, ask your health care provider. Know your numbers. Ask your health care provider to check your cholesterol and your blood sugar (glucose). Continue to have your blood tested as directed by your health care provider. Do I need screening for cancer? Depending  on your health history and family history, you may need to have cancer screenings at different stages of your life. This may include screening for: Breast cancer. Cervical cancer. Lung cancer. Colorectal  cancer. What is my risk for osteoporosis? After menopause, you may be at increased risk for osteoporosis. Osteoporosis is a condition in which bone destruction happens more quickly than new bone creation. To help prevent osteoporosis or the bone fractures that can happen because of osteoporosis, you may take the following actions: If you are 14-77 years old, get at least 1,000 mg of calcium and at least 600 international units (IU) of vitamin D per day. If you are older than age 48 but younger than age 28, get at least 1,200 mg of calcium and at least 600 international units (IU) of vitamin D per day. If you are older than age 38, get at least 1,200 mg of calcium and at least 800 international units (IU) of vitamin D per day. Smoking and drinking excessive alcohol increase the risk of osteoporosis. Eat foods that are rich in calcium and vitamin D, and do weight-bearing exercises several times each week as directed by your health care provider. How does menopause affect my mental health? Depression may occur at any age, but it is more common as you become older. Common symptoms of depression include: Feeling depressed. Changes in sleep patterns. Changes in appetite or eating patterns. Feeling an overall lack of motivation or enjoyment of activities that you previously enjoyed. Frequent crying spells. Talk with your health care provider if you think that you are experiencing any of these symptoms. General instructions See your health care provider for regular wellness exams and vaccines. This may include: Scheduling regular health, dental, and eye exams. Getting and maintaining your vaccines. These include: Influenza vaccine. Get this vaccine each year before the flu season begins. Pneumonia vaccine. Shingles vaccine. Tetanus, diphtheria, and pertussis (Tdap) booster vaccine. Your health care provider may also recommend other immunizations. Tell your health care provider if you have ever been  abused or do not feel safe at home. Summary Menopause is a normal process in which your ability to get pregnant comes to an end. This condition causes hot flashes, night sweats, decreased interest in sex, mood swings, headaches, or lack of sleep. Treatment for this condition may include hormone replacement therapy. Take actions to keep yourself healthy, including exercising regularly, eating a healthy diet, watching your weight, and checking your blood pressure and blood sugar levels. Get screened for cancer and depression. Make sure that you are up to date with all your vaccines. This information is not intended to replace advice given to you by your health care provider. Make sure you discuss any questions you have with your health care provider. Document Revised: 01/01/2021 Document Reviewed: 01/01/2021 Elsevier Patient Education  2024 [employer].

## 2023-08-12 NOTE — Assessment & Plan Note (Signed)
Following with [facility] for surveillance of steatosis.  Pending bone density based on persistently elevated alk phos.  Plan to repeat alk phos, fractionate at follow up.

## 2023-08-12 NOTE — Telephone Encounter (Signed)
Error

## 2023-08-12 NOTE — Assessment & Plan Note (Signed)
Colonoscopy up-to-date.  Clinical breast exam performed today.  Strongly advised to have cervical cancer screening, pelvic exam today.  Patient politely declines.  She very much would like to consider this and let me know if follow-up.

## 2023-08-13 ENCOUNTER — Telehealth: Payer: Self-pay

## 2023-08-13 NOTE — Telephone Encounter (Signed)
Left detailed message that we need to recheck labs in 3 months-I le patient know she can call the day before to let me know when she will be coming. I ask patient to return call to confirm.   [name], Schedule lab visit in 3 months to check hepatic panal and Vitmin D.  Dx: Elevated Alkaline Phosphatase and Low Vitamin D.  Celso Amy, PA-C

## 2023-10-14 IMAGING — US US EXTREM UP *R* LTD
1 series · 14 of 17 positions shown · non-contrast
Comparison: None Available.

CLINICAL DATA: right hand palm mass, eval ganglion cyst

EXAM:
ULTRASOUND RIGHT UPPER EXTREMITY LIMITED
TECHNIQUE: Ultrasound examination of the upper extremity soft tissues was
performed in the area of clinical concern.

[Series 1: us extrem up *right* ltd · 0.07mm/px · 17 acquisitions, 14 frames shown]
[im 1/17]
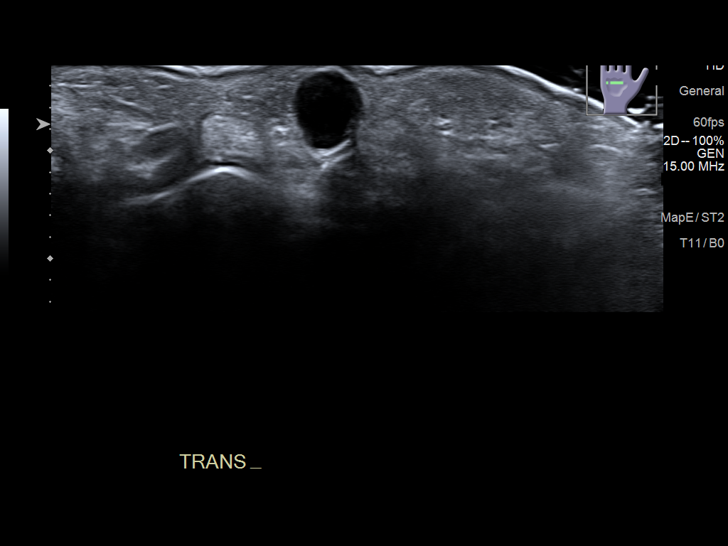
[im 2/17]
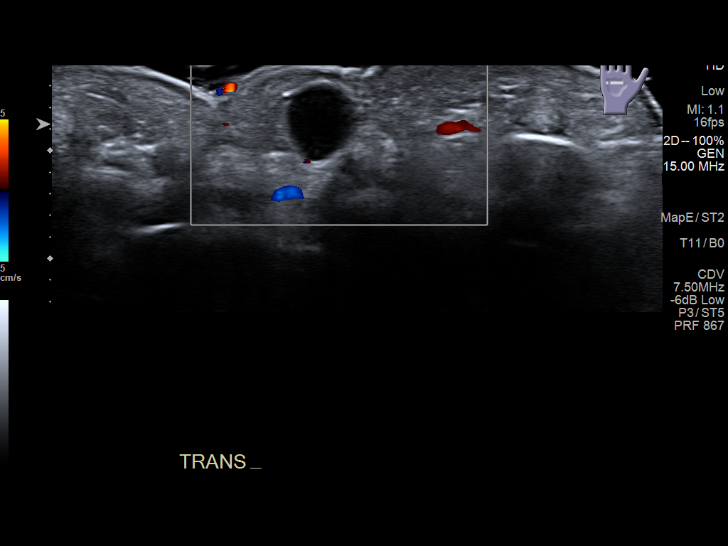
[im 4/17]
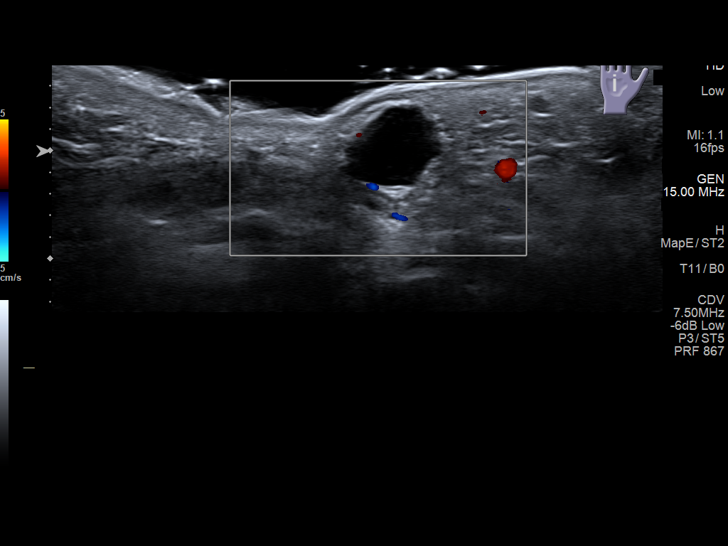
[im 5/17]
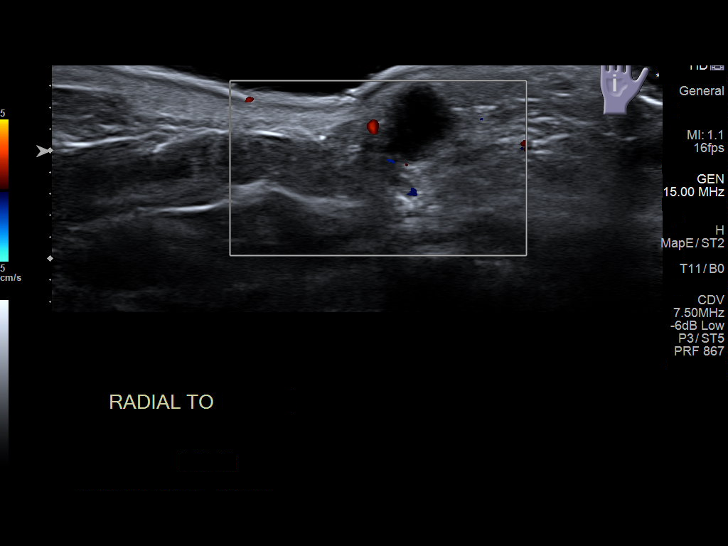
[im 6/17]
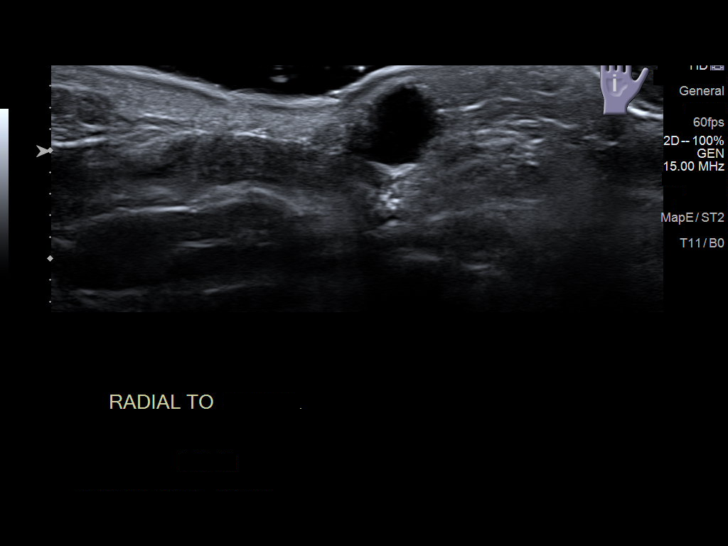
[im 7/17]
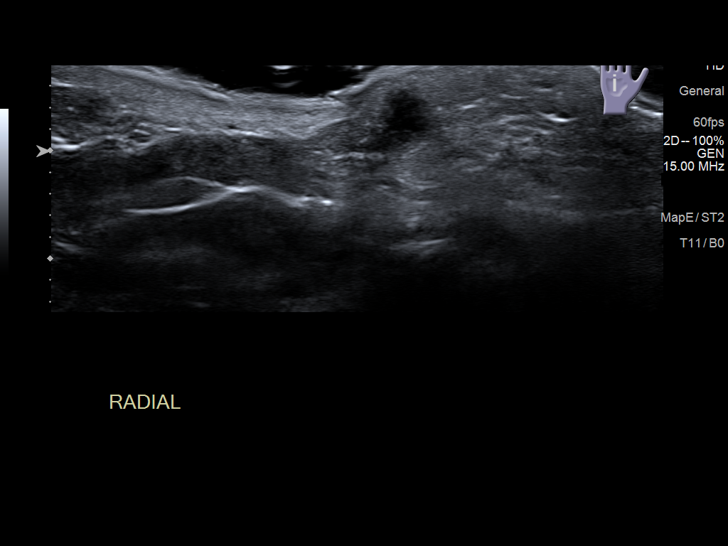
[im 8/17]
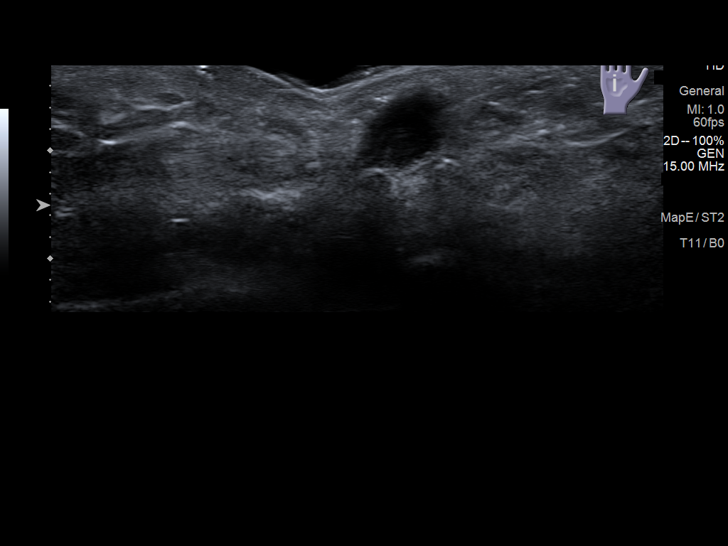
[im 10/17]
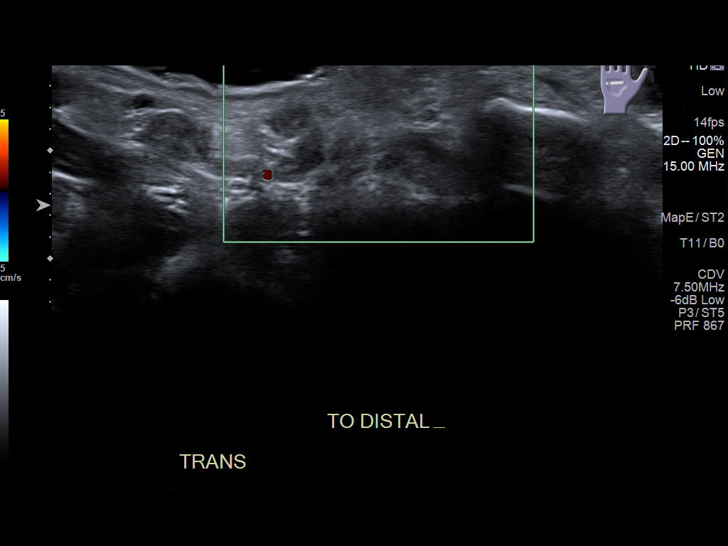
[im 11/17]
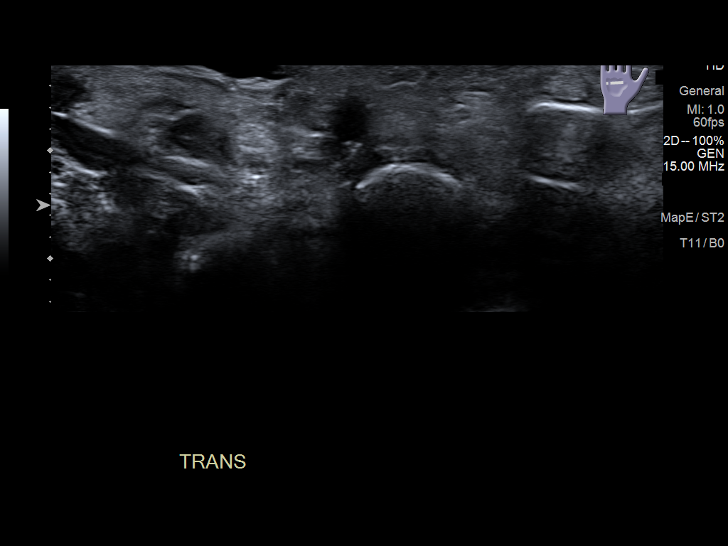
[im 12/17]
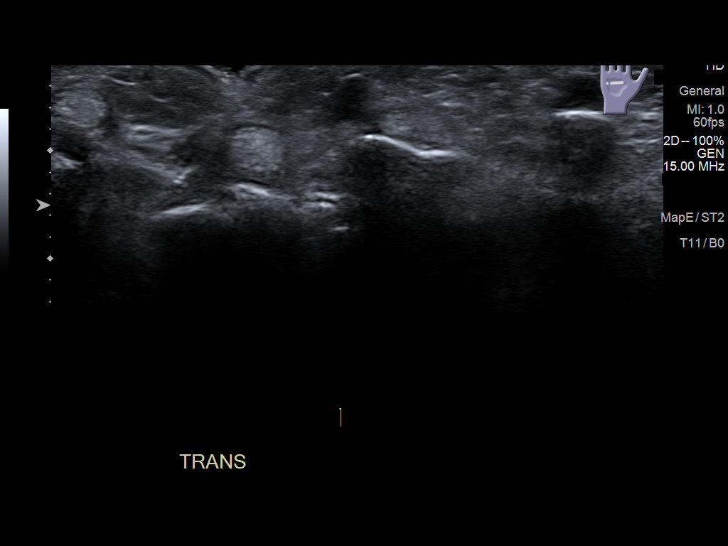
[im 13/17]
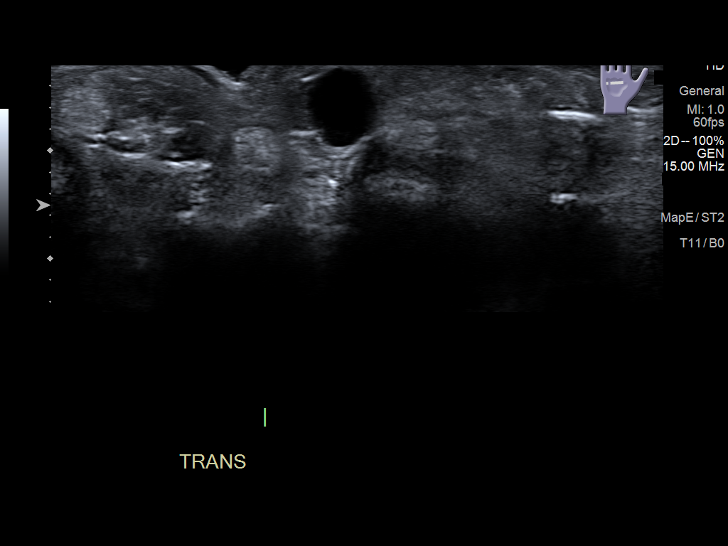
[im 14/17]
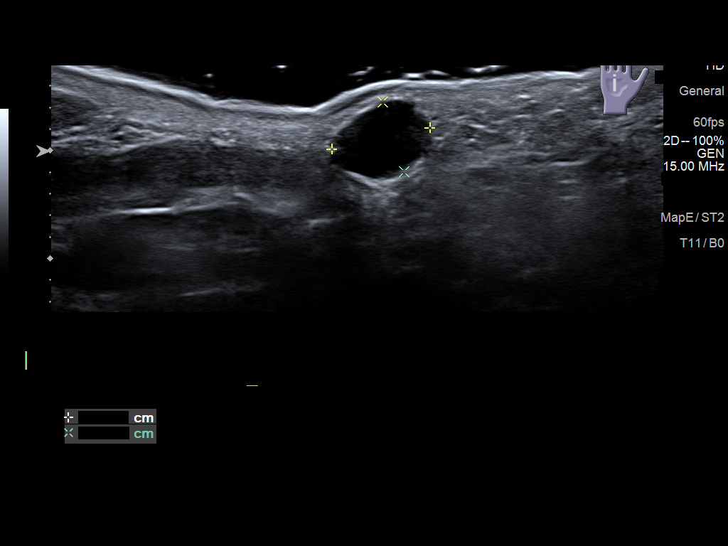
[im 16/17]
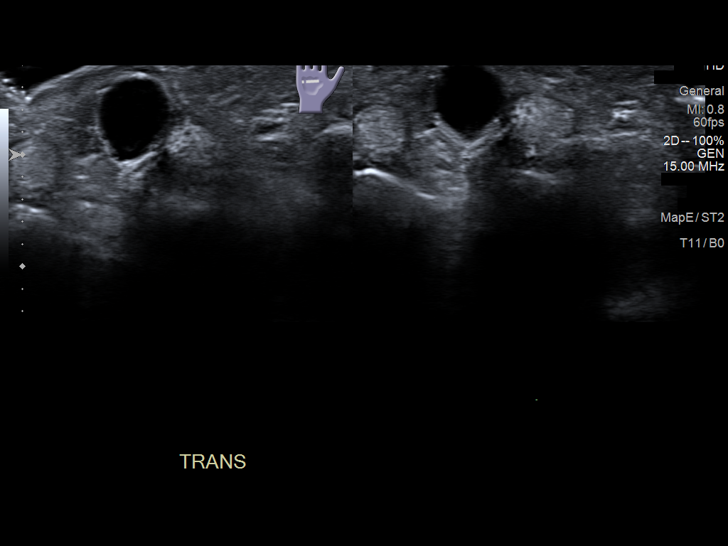
[im 17/17]
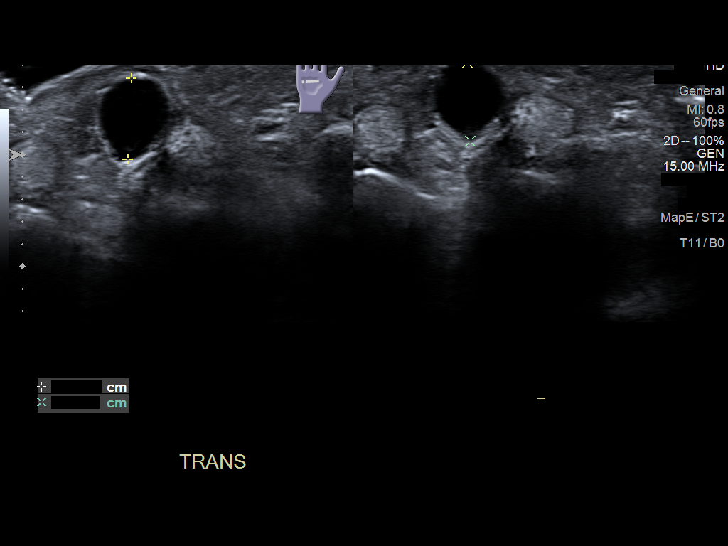

[14 of 17 positions shown; findings below may reference images not displayed]

FINDINGS: Along the palm of the right hand, there is an anechoic cystic
structure without internal vascularity measuring 0.9 x 0.7 x 0.7 cm.
This correlates with the palpable area of concern. This cystic
structure abuts a flexor tendon.
IMPRESSION: 0.9 cm ganglion cyst along the palm of the right hand corresponding
to the palpable area of concern.

## 2023-12-23 ENCOUNTER — Other Ambulatory Visit: Payer: Self-pay

## 2023-12-23 NOTE — Telephone Encounter (Signed)
 Dr. Antony Baumgartner, patient's pharmacy sent a refill request for Vitamin D2 50,000 international units one capsule every 7 days. Brian Campanile had checked her Vitamin D  levels in December 2024 and she was low. Please advise.

## 2023-12-31 ENCOUNTER — Telehealth: Payer: Self-pay

## 2023-12-31 NOTE — Telephone Encounter (Signed)
 Recv'd Vitamin D2 medication refill request form pharmacy- left message for patient letting her know we can not fill until she has recheck on Vitamin D2 lab.

## 2024-01-09 ENCOUNTER — Other Ambulatory Visit: Payer: Self-pay | Admitting: Family

## 2024-01-09 DIAGNOSIS — Z780 Asymptomatic menopausal state: Secondary | ICD-10-CM

## 2024-01-09 DIAGNOSIS — Z0000 Encounter for general adult medical examination without abnormal findings: Secondary | ICD-10-CM

## 2024-02-10 ENCOUNTER — Encounter: Payer: Self-pay | Admitting: Family

## 2024-02-10 ENCOUNTER — Ambulatory Visit (INDEPENDENT_AMBULATORY_CARE_PROVIDER_SITE_OTHER): Payer: 59 | Admitting: Family

## 2024-02-10 VITALS — BP 128/76 | HR 84 | Temp 98.2°F | Ht 59.0 in | Wt 231.4 lb

## 2024-02-10 DIAGNOSIS — K760 Fatty (change of) liver, not elsewhere classified: Secondary | ICD-10-CM | POA: Diagnosis not present

## 2024-02-10 DIAGNOSIS — J3489 Other specified disorders of nose and nasal sinuses: Secondary | ICD-10-CM | POA: Diagnosis not present

## 2024-02-10 DIAGNOSIS — Z8639 Personal history of other endocrine, nutritional and metabolic disease: Secondary | ICD-10-CM

## 2024-02-10 DIAGNOSIS — I10 Essential (primary) hypertension: Secondary | ICD-10-CM | POA: Diagnosis not present

## 2024-02-10 DIAGNOSIS — E782 Mixed hyperlipidemia: Secondary | ICD-10-CM

## 2024-02-10 MED ORDER — MUPIROCIN 2 % EX OINT
1.0000 | TOPICAL_OINTMENT | Freq: Two times a day (BID) | CUTANEOUS | 2 refills | Status: AC
Start: 2024-02-10 — End: ?

## 2024-02-10 MED ORDER — MUPIROCIN 2 % EX OINT: 1.0000 | TOPICAL_OINTMENT | Freq: Two times a day (BID) | CUTANEOUS | 2 refills | Status: DC

## 2024-02-10 MED ORDER — AMLODIPINE BESYLATE 10 MG PO TABS: ORAL_TABLET | ORAL | 3 refills | Status: AC

## 2024-02-10 NOTE — Assessment & Plan Note (Addendum)
 Left nasal lesion, non healing. Pending wound culture.  Start bactroban.  Advised no cleaning her nose, no nasal sprays.  Referral to ENT for evaluation, biopsy if needed.

## 2024-02-10 NOTE — Addendum Note (Signed)
 Addended by: Thressa Flora D on: 02/10/2024 11:45 AM   Modules accepted: Orders

## 2024-02-10 NOTE — Assessment & Plan Note (Signed)
 Chronic, historically well controlled. Pending lipid panel. Continue Crestor  10 mg

## 2024-02-10 NOTE — Assessment & Plan Note (Signed)
 Discussed lifestyle intervention in regarding to achieving normal BMI, management of HLD, HTN to thwart progression. Emphasized the importance of continued follow up with GI for surveillance. Referral placed.

## 2024-02-10 NOTE — Progress Notes (Signed)
 Assessment & Plan:  Essential hypertension Assessment & Plan: Chronic, stable. Continue amlodipine  10mg  qd  Orders: -     TSH -     amLODIPine  Besylate; TAKE 1 TABLET(10 MG) BY MOUTH DAILY  Dispense: 90 tablet; Refill: 3  Nasal lesion Assessment & Plan: Left nasal lesion, non healing. Pending wound culture.  Start bactroban.  Advised no cleaning her nose, no nasal sprays.  Referral to ENT for evaluation, biopsy if needed.   Orders: -     WOUND CULTURE -     Ambulatory referral to ENT -     Mupirocin; Apply 1 Application topically 2 (two) times daily. In nose  Dispense: 22 g; Refill: 2  Hepatic steatosis Assessment & Plan: Discussed lifestyle intervention in regarding to achieving normal BMI, management of HLD, HTN to thwart progression. Emphasized the importance of continued follow up with GI for surveillance. Referral placed.   Orders: -     Hemoglobin A1c -     CBC with Differential/Platelet -     Comprehensive metabolic panel with GFR -     Lipid panel -     Alkaline phosphatase, isoenzymes -     Ambulatory referral to Gastroenterology  History of vitamin D  deficiency -     VITAMIN D  25 Hydroxy (Vit-D Deficiency, Fractures)  Mixed hyperlipidemia Assessment & Plan: Chronic, historically well controlled. Pending lipid panel. Continue Crestor  10 mg      Return precautions given.   Risks, benefits, and alternatives of the medications and treatment plan prescribed today were discussed, and patient expressed understanding.   Education regarding symptom management and diagnosis given to patient on AVS either electronically or printed.  No follow-ups on file.  Bascom Bossier, FNP  Subjective:    Patient ID: Emma Riggs, female    DOB: November 11, 1965, 58 y.o.   MRN: [id number]  CC: Emma Riggs is a 58 y.o. female who presents today for follow up.   HPI: Left inside lesion that has bleeding for months. Lesion will get a scab and then will start bleeding.  She  is cleans her nose with a tissue due to congestion.  No use nasal sprays, easy bruising  No h/o skin cancer      Allergies: Patient has no known allergies. Current Outpatient Medications on File Prior to Visit  Medication Sig Dispense Refill   Cholecalciferol  (VITAMIN D3) 20 MCG (800 UNIT) TABS Take 1 tablet by mouth daily.     clotrimazole  (LOTRIMIN ) 1 % cream Apply 1 Application topically 2 (two) times daily. 30 g 1   Multiple Vitamin (MULTIVITAMIN) tablet Take 1 tablet by mouth daily.     rosuvastatin  (CRESTOR ) 10 MG tablet TAKE 1 TABLET(10 MG) BY MOUTH DAILY 90 tablet 3   No current facility-administered medications on file prior to visit.    Review of Systems  Constitutional:  Negative for chills and fever.  Respiratory:  Negative for cough and shortness of breath.   Cardiovascular:  Negative for chest pain and palpitations.  Gastrointestinal:  Negative for nausea and vomiting.  Skin:  Positive for wound.  Hematological:  Does not bruise/bleed easily.      Objective:    BP 128/76   Pulse 84   Temp 98.2 F (36.8 C) (Oral)   Ht 4' 11 (1.499 m)   Wt 231 lb 6.4 oz (105 kg)   SpO2 99%   BMI 46.74 kg/m  BP Readings from Last 3 Encounters:  02/10/24 128/76  08/12/23 122/82  08/06/23  119/78   Wt Readings from Last 3 Encounters:  02/10/24 231 lb 6.4 oz (105 kg)  08/12/23 228 lb 3.2 oz (103.5 kg)  08/06/23 226 lb (102.5 kg)    Physical Exam Vitals reviewed.  Constitutional:      Appearance: She is well-developed.  HENT:     Head: Normocephalic and atraumatic.     Right Ear: Hearing, tympanic membrane, ear canal and external ear normal. No decreased hearing noted. No drainage, swelling or tenderness. No middle ear effusion. No foreign body. Tympanic membrane is not erythematous or bulging.     Left Ear: Hearing, tympanic membrane, ear canal and external ear normal. No decreased hearing noted. No drainage, swelling or tenderness.  No middle ear effusion. No foreign  body. Tympanic membrane is not erythematous or bulging.     Nose: Nose normal. No rhinorrhea.     Right Sinus: No maxillary sinus tenderness or frontal sinus tenderness.     Left Sinus: No maxillary sinus tenderness or frontal sinus tenderness.      Comments: Left medial side of left nare macerated skin with scant BRB. Culture obtained.     Mouth/Throat:     Pharynx: Uvula midline. No oropharyngeal exudate or posterior oropharyngeal erythema.     Tonsils: No tonsillar abscesses.   Eyes:     Conjunctiva/sclera: Conjunctivae normal.    Cardiovascular:     Rate and Rhythm: Regular rhythm.     Pulses: Normal pulses.     Heart sounds: Normal heart sounds.  Pulmonary:     Effort: Pulmonary effort is normal.     Breath sounds: Normal breath sounds. No wheezing, rhonchi or rales.  Lymphadenopathy:     Head:     Right side of head: No submental, submandibular, tonsillar, preauricular, posterior auricular or occipital adenopathy.     Left side of head: No submental, submandibular, tonsillar, preauricular, posterior auricular or occipital adenopathy.     Cervical: No cervical adenopathy.   Skin:    General: Skin is warm and dry.   Neurological:     Mental Status: She is alert.   Psychiatric:        Speech: Speech normal.        Behavior: Behavior normal.        Thought Content: Thought content normal.

## 2024-02-10 NOTE — Assessment & Plan Note (Signed)
Chronic, stable. Continue amlodipine '10mg'$  qd

## 2024-02-10 NOTE — Patient Instructions (Addendum)
 Referral to ENT for nose lesion and need for biopsy.  Referral to Gastroenterology  Nonalcoholic Fatty Liver Disease Diet, Adult Nonalcoholic fatty liver disease is a condition that causes fat to build up in and around the liver. The disease makes it harder for the liver to work the way that it should. Eating a healthy diet of fruits, vegetables, whole grains, lean proteins, and limiting added sugar and fats can help to keep nonalcoholic fatty liver disease under control. It can also help to prevent or improve conditions that are related to the disease, such as heart disease, diabetes, high blood pressure, obesity, and high cholesterol. Along with regular exercise, this diet: Promotes weight loss. Helps to control blood sugar levels. Helps to improve the way that the body uses insulin. What are tips for following this plan? Reading food labels Always check food labels for: The amount of saturated fat in a food. You should limit how much saturated fat you eat. Saturated fat is found in foods that come from animals, including meat and dairy products such as butter, cheese, and whole milk. The amount of fiber in a food. You should choose high-fiber foods such as fruits, vegetables, and whole grains. Try to get 25-30 grams (g) of fiber a day. Added sugar. Avoid foods with a high amount of added sugar and high fructose corn syrup. Avoid sweetened soft drinks, sweetened tea, lemonade, sports drinks, and juices that are not 100% juice. Aim for foods with less than 5 grams of added sugar. Every 4 grams of added sugar is 1 teaspoon (tsp) of sugar per serving. Cooking When cooking, use heart-healthy oils that are high in monounsaturated fats. These include olive oil, canola oil, and avocado oil. Limit frying or deep-frying foods. Cook foods using healthy methods such as baking, boiling, steaming, and grilling instead. Meal planning You may want to keep track of how many calories you eat and drink.  Eating the right amount of calories will help you achieve a healthy weight. Meeting with a dietitian can help you get started. Limit how often you eat takeout and fast food. These foods are usually very high in fat, salt, and sugar. Use the glycemic index (GI) to plan your meals. The index tells you how quickly a food will raise your blood sugar. Choose low-GI foods. Low-GI foods have a GI less than 55. These foods take a longer time to raise blood sugar. A dietitian can help you pick foods that are lower on the GI scale. Try to include some meals each week that replace meat with beans or legumes. Add fish 2-3 times a week, especially heart healthy oily fishes like salmon, sardines, trout, tuna, or mackerel. Lifestyle You may want to follow a Mediterranean diet. This diet includes a lot of vegetables, lean meats or fish, nuts and seeds, whole grains, fruits, and healthy oils and fats. What foods can I eat?  Fruits Apples. Bananas. Pears. Grapes. Papaya. Plums. Kiwi. Grapefruit. Cherries. Strawberries. Vegetables Lettuce. Spinach. Peas. Beets. Cauliflower. Cabbage. Broccoli. Carrots. Tomatoes. Squash. Eggplant. Herbs. Peppers. Onions. Cucumbers. Brussels sprouts. Yams and sweet potatoes. Grains Whole wheat or whole-grain foods, including breads, crackers, cereals, and pasta. Stone-ground whole wheat. Unsweetened oatmeal. Bulgur. Barley. Quinoa. Brown or wild rice. Corn or whole wheat flour tortillas. Meats and other proteins Lean meats. Poultry. Tofu. Seafood and shellfish. Beans. Lentils. Dairy Low-fat or fat-free dairy products, such as yogurt, cottage cheese, or cheese. Beverages Water . Sugar-free drinks. Tea. Coffee. Low-fat or skim milk. Milk alternatives, such as  unsweetened soy, oat, or almond milk. Real fruit juice. Fats and oils Avocado. Canola or olive oil. Nuts and nut butters. Seeds. Seasonings and condiments Mustard. Relish. Low-fat, low-sugar ketchup and barbecue sauce. Low-fat  or fat-free mayonnaise. Sweets and desserts Sugar-free sweets. The items listed above may not be all the foods and drinks you can have. Talk to a dietitian to learn more. What foods should I limit or avoid? Grains White rice. Pasta. Breads. Meats and other proteins Limit red meat to 1-2 times a week. Dairy Full-fat dairy. Fats and oils Palm oil and coconut oil. Fried foods. Other foods Processed foods. Foods that contain a lot of salt (sodium) or added sugar. Sweets and desserts Sweets that contain sugar. Bakery items such as cookies, cakes, and other pastries. Beverages Sweetened drinks, such as sweet tea, milkshakes, iced sweet drinks, and sodas. Alcohol. The items listed above may not be all the foods and drinks you should avoid. Talk to a dietitian to learn more. Where to find more information The [employer] of Diabetes and Digestive and Kidney Diseases: [employer] This information is not intended to replace advice given to you by your health care provider. Make sure you discuss any questions you have with your health care provider. Document Revised: 05/27/2022 Document Reviewed: 05/27/2022 Elsevier Patient Education  2024 [employer].

## 2024-02-11 ENCOUNTER — Other Ambulatory Visit (INDEPENDENT_AMBULATORY_CARE_PROVIDER_SITE_OTHER)

## 2024-02-11 DIAGNOSIS — I10 Essential (primary) hypertension: Secondary | ICD-10-CM | POA: Diagnosis not present

## 2024-02-11 DIAGNOSIS — Z8639 Personal history of other endocrine, nutritional and metabolic disease: Secondary | ICD-10-CM | POA: Diagnosis not present

## 2024-02-11 DIAGNOSIS — K760 Fatty (change of) liver, not elsewhere classified: Secondary | ICD-10-CM

## 2024-02-12 LAB — CBC WITH DIFFERENTIAL/PLATELET
EOS (ABSOLUTE): 0.2 10*3/uL (ref 0.0–0.4)
Eos: 4 %
Lymphocytes Absolute: 1.6 10*3/uL (ref 0.7–3.1)
Lymphs: 35 %
Neutrophils Absolute: 2.3 10*3/uL (ref 1.4–7.0)

## 2024-02-12 LAB — COMPREHENSIVE METABOLIC PANEL WITH GFR
Albumin: 4.8 g/dL (ref 3.8–4.9)
BUN: 10 mg/dL (ref 6–24)
Bilirubin Total: 0.5 mg/dL (ref 0.0–1.2)
Chloride: 105 mmol/L (ref 96–106)
Globulin, Total: 3.5 g/dL (ref 1.5–4.5)
Potassium: 5.3 mmol/L — ABNORMAL HIGH (ref 3.5–5.2)
Total Protein: 8.3 g/dL (ref 6.0–8.5)

## 2024-02-12 LAB — LIPID PANEL: Cholesterol, Total: 175 mg/dL (ref 100–199)

## 2024-02-13 ENCOUNTER — Ambulatory Visit: Payer: Self-pay | Admitting: Family

## 2024-02-13 DIAGNOSIS — R899 Unspecified abnormal finding in specimens from other organs, systems and tissues: Secondary | ICD-10-CM

## 2024-02-13 DIAGNOSIS — J3489 Other specified disorders of nose and nasal sinuses: Secondary | ICD-10-CM

## 2024-02-13 LAB — WOUND CULTURE
MICRO NUMBER:: 16590497
SPECIMEN QUALITY:: ADEQUATE

## 2024-02-15 LAB — COMPREHENSIVE METABOLIC PANEL WITH GFR
ALT: 19 IU/L (ref 0–32)
AST: 29 IU/L (ref 0–40)
Alkaline Phosphatase: 140 IU/L — ABNORMAL HIGH (ref 44–121)
BUN/Creatinine Ratio: 13 (ref 9–23)
Calcium: 10 mg/dL (ref 8.7–10.2)
Creatinine, Ser: 0.8 mg/dL (ref 0.57–1.00)
Glucose: 95 mg/dL (ref 70–99)
Sodium: 143 mmol/L (ref 134–144)
eGFR: 85 mL/min/{1.73_m2} (ref >59)

## 2024-02-15 LAB — LIPID PANEL
Chol/HDL Ratio: 2.6 ratio (ref 0.0–4.4)
HDL: 68 mg/dL (ref >39)
LDL Chol Calc (NIH): 87 mg/dL (ref 0–99)
Triglycerides: 111 mg/dL (ref 0–149)
VLDL Cholesterol Cal: 20 mg/dL (ref 5–40)

## 2024-02-15 LAB — CBC WITH DIFFERENTIAL/PLATELET
Basophils Absolute: 0.1 10*3/uL (ref 0.0–0.2)
Basos: 1 %
Hematocrit: 41.9 % (ref 34.0–46.6)
Hemoglobin: 13.2 g/dL (ref 11.1–15.9)
Immature Grans (Abs): 0 10*3/uL (ref 0.0–0.1)
Immature Granulocytes: 0 %
MCH: 30.5 pg (ref 26.6–33.0)
MCHC: 31.5 g/dL (ref 31.5–35.7)
MCV: 97 fL (ref 79–97)
Monocytes Absolute: 0.5 10*3/uL (ref 0.1–0.9)
Monocytes: 10 %
Neutrophils: 50 %
Platelets: 384 10*3/uL (ref 150–450)
RBC: 4.33 x10E6/uL (ref 3.77–5.28)
RDW: 13.2 % (ref 11.7–15.4)
WBC: 4.5 10*3/uL (ref 3.4–10.8)

## 2024-02-15 LAB — HEMOGLOBIN A1C
Est. average glucose Bld gHb Est-mCnc: 128 mg/dL
Hgb A1c MFr Bld: 6.1 % — ABNORMAL HIGH (ref 4.8–5.6)

## 2024-02-15 LAB — VITAMIN D 25 HYDROXY (VIT D DEFICIENCY, FRACTURES): Vit D, 25-Hydroxy: 41.1 ng/mL (ref 30.0–100.0)

## 2024-02-15 LAB — TSH: TSH: 0.973 u[IU]/mL (ref 0.450–4.500)

## 2024-02-15 LAB — ALKALINE PHOSPHATASE, ISOENZYMES
BONE FRACTION: 43 % (ref 14–68)
INTESTINAL FRAC.: 0 % (ref 0–18)
LIVER FRACTION: 57 % (ref 18–85)

## 2024-02-16 MED ORDER — CLINDAMYCIN PHOS (ONCE-DAILY) 1 % EX GEL: 1.0000 | Freq: Every day | CUTANEOUS | 0 refills | Status: AC

## 2024-02-18 NOTE — Addendum Note (Signed)
 Addended by: Annora Guderian on: 02/18/2024 11:22 AM   Modules accepted: Orders

## 2024-02-26 ENCOUNTER — Ambulatory Visit
Admission: RE | Admit: 2024-02-26 | Discharge: 2024-02-26 | Disposition: A | Discharge status: Home / Self Care | Source: Ambulatory Visit | Attending: Family | Admitting: Family

## 2024-02-26 DIAGNOSIS — Z1231 Encounter for screening mammogram for malignant neoplasm of breast: Secondary | ICD-10-CM | POA: Diagnosis not present

## 2024-02-26 DIAGNOSIS — Z780 Asymptomatic menopausal state: Secondary | ICD-10-CM

## 2024-02-26 DIAGNOSIS — Z0000 Encounter for general adult medical examination without abnormal findings: Secondary | ICD-10-CM | POA: Diagnosis not present

## 2024-03-02 ENCOUNTER — Ambulatory Visit: Payer: Self-pay | Admitting: Family

## 2024-03-03 ENCOUNTER — Other Ambulatory Visit: Payer: Self-pay | Admitting: Family

## 2024-03-03 DIAGNOSIS — R748 Abnormal levels of other serum enzymes: Secondary | ICD-10-CM

## 2024-03-03 DIAGNOSIS — R928 Other abnormal and inconclusive findings on diagnostic imaging of breast: Secondary | ICD-10-CM

## 2024-03-04 ENCOUNTER — Ambulatory Visit: Payer: Self-pay | Admitting: Family

## 2024-03-08 ENCOUNTER — Ambulatory Visit
Admission: RE | Admit: 2024-03-08 | Discharge: 2024-03-08 | Disposition: A | Discharge status: Home / Self Care | Source: Ambulatory Visit | Attending: Family | Admitting: Family

## 2024-03-08 DIAGNOSIS — N6321 Unspecified lump in the left breast, upper outer quadrant: Secondary | ICD-10-CM | POA: Diagnosis not present

## 2024-03-08 DIAGNOSIS — N6325 Unspecified lump in the left breast, overlapping quadrants: Secondary | ICD-10-CM | POA: Diagnosis not present

## 2024-03-08 DIAGNOSIS — R928 Other abnormal and inconclusive findings on diagnostic imaging of breast: Secondary | ICD-10-CM | POA: Diagnosis not present

## 2024-03-08 DIAGNOSIS — R92322 Mammographic fibroglandular density, left breast: Secondary | ICD-10-CM | POA: Diagnosis not present

## 2024-07-20 ENCOUNTER — Telehealth: Payer: Self-pay | Admitting: Family

## 2024-07-20 NOTE — Telephone Encounter (Signed)
 Spoke to pt gave her information she states that she will call them and schedule an appt   [facility] [address] [place], [address] [place] [address] [phone]

## 2024-07-20 NOTE — Telephone Encounter (Signed)
 Call pt  Multiple times [facility] have tried to contact her.  I placed referral in the setting of elevated alkaline phosphatase.  I would strongly encourage her to be evaluated.  Please ask her to call   [facility] [address] [place], [address] [place] [address] [phone]

## 2024-08-14 ENCOUNTER — Other Ambulatory Visit: Payer: Self-pay | Admitting: Family

## 2024-08-14 DIAGNOSIS — E782 Mixed hyperlipidemia: Secondary | ICD-10-CM

## 2024-09-09 ENCOUNTER — Encounter: Payer: Self-pay | Admitting: Family

## 2024-09-13 ENCOUNTER — Other Ambulatory Visit: Payer: Self-pay | Admitting: Family

## 2024-09-13 DIAGNOSIS — J3489 Other specified disorders of nose and nasal sinuses: Secondary | ICD-10-CM

## 2024-09-15 NOTE — Telephone Encounter (Signed)
 Noted.
